# Patient Record
Sex: Male | Born: 1975
Health system: Southern US, Community
[De-identification: ages and names within clinical notes are randomized; demographics above are authoritative.]

## PROBLEM LIST (undated history)

## (undated) DIAGNOSIS — Z789 Other specified health status: Secondary | ICD-10-CM

## (undated) DIAGNOSIS — U071 COVID-19: Secondary | ICD-10-CM

## (undated) HISTORY — PX: INGUINAL HERNIA REPAIR: SUR1180

---

## 2004-05-26 ENCOUNTER — Emergency Department (HOSPITAL_COMMUNITY): Admission: EM | Admit: 2004-05-26 | Discharge: 2004-05-26 | Payer: Self-pay | Admitting: Emergency Medicine

## 2004-10-31 ENCOUNTER — Emergency Department (HOSPITAL_COMMUNITY): Admission: EM | Admit: 2004-10-31 | Discharge: 2004-11-01 | Payer: Self-pay | Admitting: Emergency Medicine

## 2005-11-02 ENCOUNTER — Emergency Department (HOSPITAL_COMMUNITY): Admission: EM | Admit: 2005-11-02 | Discharge: 2005-11-02 | Payer: Self-pay | Admitting: Emergency Medicine

## 2005-12-03 ENCOUNTER — Emergency Department (HOSPITAL_COMMUNITY): Admission: EM | Admit: 2005-12-03 | Discharge: 2005-12-03 | Payer: Self-pay | Admitting: Emergency Medicine

## 2010-09-20 ENCOUNTER — Encounter: Payer: Self-pay | Admitting: Internal Medicine

## 2011-10-27 ENCOUNTER — Encounter: Payer: 59 | Admitting: Physician Assistant

## 2011-11-10 ENCOUNTER — Ambulatory Visit (INDEPENDENT_AMBULATORY_CARE_PROVIDER_SITE_OTHER): Payer: 59 | Admitting: Family Medicine

## 2011-11-10 ENCOUNTER — Ambulatory Visit: Payer: 59

## 2011-11-10 VITALS — BP 132/78 | HR 84 | Temp 98.5°F | Resp 18 | Ht 71.0 in | Wt 270.0 lb

## 2011-11-10 DIAGNOSIS — R05 Cough: Secondary | ICD-10-CM

## 2011-11-10 DIAGNOSIS — H6692 Otitis media, unspecified, left ear: Secondary | ICD-10-CM

## 2011-11-10 DIAGNOSIS — R059 Cough, unspecified: Secondary | ICD-10-CM

## 2011-11-10 DIAGNOSIS — H6690 Otitis media, unspecified, unspecified ear: Secondary | ICD-10-CM

## 2011-11-10 DIAGNOSIS — J329 Chronic sinusitis, unspecified: Secondary | ICD-10-CM

## 2011-11-10 MED ORDER — AMOXICILLIN-POT CLAVULANATE 875-125 MG PO TABS: 1.0000 | ORAL_TABLET | Freq: Two times a day (BID) | ORAL | Status: AC

## 2011-11-10 MED ORDER — HYDROCODONE-HOMATROPINE 5-1.5 MG/5ML PO SYRP: 5.0000 mL | ORAL_SOLUTION | Freq: Three times a day (TID) | ORAL | Status: AC | PRN

## 2011-11-10 NOTE — Progress Notes (Signed)
  [facility]:  Office Visit  Chief Complaint:  Chief Complaint  Patient presents with  . Cough    * 3 weeks  . Headache    sinus pain/ear pain  . Chills    HPI: Samuel Cruz is a 36 y.o. male who complains of 3 week h/o cough, dry and was given abx for sinusitis but dd not resolve completely. He aslo has sinus pressure and HA and ear pain which is new. Has had the flu vaccine. Has a hx of asthma bronchitis without formal dx of bronchitis.  No past medical history on file. No past surgical history on file. History   Social History  . Marital Status: Married    Spouse Name: N/A    Number of Children: N/A  . Years of Education: N/A   Social History Main Topics  . Smoking status: Never Smoker   . Smokeless tobacco: None  . Alcohol Use: None  . Drug Use: None  . Sexually Active: None   Other Topics Concern  . None   Social History Narrative  . None   No family history on file. No Known Allergies Prior to Admission medications   Medication Sig Start Date End Date Taking? Authorizing Provider  amoxicillin-clavulanate (AUGMENTIN) 875-125 MG per tablet Take 1 tablet by mouth 2 (two) times daily. 11/10/11 11/20/11  Shalev Helminiak P Oluwanifemi Susman, DO  HYDROcodone-homatropine (HYCODAN) 5-1.5 MG/5ML syrup Take 5 mLs by mouth every 8 (eight) hours as needed for cough. 11/10/11 11/20/11  Rachelann Enloe P Malan Werk, DO     ROS: The patient denies fevers, chills, night sweats, unintentional weight loss, chest pain, palpitations, wheezing, dyspnea on exertion, nausea, vomiting, abdominal pain, dysuria, hematuria, melena, numbness, weakness, or tingling.   All other systems have been reviewed and were otherwise negative with the exception of those mentioned in the HPI and as above.    PHYSICAL EXAM: Filed Vitals:   11/10/11 2011  BP: 132/78  Pulse: 84  Temp: 98.5 F (36.9 C)  Resp: 18   Filed Vitals:   11/10/11 2011  Height: 5\' 11"  (1.803 m)  Weight: 270 lb (122.471 kg)   Body mass index is  37.66 kg/(m^2).  General: Alert, no acute distress HEENT:  Normocephalic, atraumatic, oropharynx patent. Left TM red, boggy Cardiovascular:  Regular rate and rhythm, no rubs murmurs or gallops.  No Carotid bruits, radial pulse intact. No pedal edema.  Respiratory: Clear to auscultation bilaterally.  No wheezes, rales, or rhonchi.  No cyanosis, no use of accessory musculature GI: No organomegaly, abdomen is soft and non-tender, positive bowel sounds.  No masses. Skin: No rashes. Neurologic: Facial musculature symmetric. Psychiatric: Patient is appropriate throughout our interaction. Lymphatic: No cervical lymphadenopathy Musculoskeletal: Gait intact.   LABS: No results found for this or any previous visit.   EKG/XRAY:   Primary read interpreted by Dr. Conley Rolls at [facility]. NO infiltrate or pneumothorax. Increase vascularization.   ASSESSMENT/PLAN: Encounter Diagnoses  Name Primary?  . Cough Yes  . Left otitis media   . Sinusitis     Rx Augmentin 875 mg BID x 10 days Tessalon Perles Hydromet syrup.   Hamilton Capri PHUONG, DO 11/10/2011 8:56 PM

## 2011-11-11 ENCOUNTER — Telehealth: Payer: Self-pay | Admitting: Family Medicine

## 2011-11-11 NOTE — Telephone Encounter (Signed)
LM that official CXR was negative for any acute process.

## 2012-01-14 ENCOUNTER — Telehealth: Payer: Self-pay

## 2012-01-18 NOTE — Telephone Encounter (Signed)
Will close.

## 2012-05-06 NOTE — Progress Notes (Signed)
This encounter was created in error - please disregard.

## 2013-01-28 ENCOUNTER — Encounter (HOSPITAL_COMMUNITY): Payer: Self-pay | Admitting: Emergency Medicine

## 2013-01-28 ENCOUNTER — Emergency Department (HOSPITAL_COMMUNITY)
Admission: EM | Admit: 2013-01-28 | Discharge: 2013-01-28 | Disposition: A | Discharge status: Home / Self Care | Payer: 59 | Source: Home / Self Care | Attending: Emergency Medicine | Admitting: Emergency Medicine

## 2013-01-28 DIAGNOSIS — J020 Streptococcal pharyngitis: Secondary | ICD-10-CM

## 2013-01-28 LAB — POCT RAPID STREP A: Streptococcus, Group A Screen (Direct): POSITIVE — AB

## 2013-01-28 MED ORDER — PENICILLIN V POTASSIUM 500 MG PO TABS: 500.0000 mg | ORAL_TABLET | Freq: Three times a day (TID) | ORAL | Status: DC

## 2013-01-28 NOTE — ED Provider Notes (Signed)
Chief Complaint:  No chief complaint on file.   History of Present Illness:   Samuel Cruz is a 37 year old male who has had a two-day history of myalgias, malaise, fatigue, headache, low-grade fever, nausea, sore throat, pain on swallowing, and aching behind his eyes. He denies any nasal congestion, rhinorrhea, swollen glands, cough, or GI symptoms. He was exposed to his wife who had strep about a month ago. He himself has had strep before but it's been years.  Review of Systems:  Other than as noted above, the patient denies any of the following symptoms. Systemic:  No fever, chills, sweats, fatigue, myalgias, headache, or anorexia. Eye:  No redness, pain or drainage. ENT:  No earache, ear congestion, nasal congestion, sneezing, rhinorrhea, sinus pressure, sinus pain, or post nasal drip. Lungs:  No cough, sputum production, wheezing, shortness of breath, or chest pain. GI:  No abdominal pain, nausea, vomiting, or diarrhea. Skin:  No rash or itching.  PMFSH:  Past medical history, family history, social history, meds, allergies, and nurse's notes were reviewed.  There is no known exposure to strep or mono.  No prior history of step or mono.  The patient denies use of tobacco.   Physical Exam:   Vital signs:  BP 110/57  Pulse 66  Temp(Src) 98.3 F (36.8 C) (Oral)  SpO2 97% General:  Alert, in no distress. Eye:  No conjunctival injection or drainage. Lids were normal. ENT:  TMs and canals were normal, without erythema or inflammation.  Nasal mucosa was clear and uncongested, without drainage.  Mucous membranes were moist.  Exam of pharynx reveals erythema but no exudate or drainage.  There were no oral ulcerations or lesions. Neck:  Supple, no adenopathy, tenderness or mass. Lungs:  No respiratory distress.  Lungs were clear to auscultation, without wheezes, rales or rhonchi.  Breath sounds were clear and equal bilaterally.  Heart:  Regular rhythm, without gallops, murmers or rubs. Skin:   Clear, warm, and dry, without rash or lesions.  Labs:   Results for orders placed during the hospital encounter of 01/28/13  POCT RAPID STREP A ([facility] ONLY)      Result Value Range   Streptococcus, Group A Screen (Direct) POSITIVE (*) NEGATIVE   Assessment:  The encounter diagnosis was Strep throat.  Has typical strep throat. No evidence for peritonsillar abscess. He probably caught it from his wife.  Plan:   1.  The following meds were prescribed:   New Prescriptions   PENICILLIN V POTASSIUM (VEETID) 500 MG TABLET    Take 1 tablet (500 mg total) by mouth 3 (three) times daily.   2.  The patient was instructed in symptomatic care including hot saline gargles, throat lozenges, infectious precautions, and need to trade out toothbrush. Handouts were given. 3.  The patient was told to return if becoming worse in any way, if no better in 2 or 3 days, and given some red flag symptoms such as difficulty swallowing or breathing that would indicate earlier return. 4.  Follow up here if no better in 2 or 3 days.    Reuben Likes, MD 01/28/13 [phone]

## 2013-02-24 ENCOUNTER — Emergency Department (HOSPITAL_COMMUNITY)
Admission: EM | Admit: 2013-02-24 | Discharge: 2013-02-24 | Disposition: A | Discharge status: Home / Self Care | Payer: 59 | Source: Home / Self Care | Attending: Emergency Medicine | Admitting: Emergency Medicine

## 2013-02-24 ENCOUNTER — Encounter (HOSPITAL_COMMUNITY): Payer: Self-pay | Admitting: Emergency Medicine

## 2013-02-24 DIAGNOSIS — M109 Gout, unspecified: Secondary | ICD-10-CM

## 2013-02-24 LAB — URIC ACID: Uric Acid, Serum: 5.9 mg/dL (ref 4.0–7.8)

## 2013-02-24 MED ORDER — OXYCODONE-ACETAMINOPHEN 5-325 MG PO TABS: ORAL_TABLET | ORAL | Status: DC

## 2013-02-24 MED ORDER — METHYLPREDNISOLONE ACETATE 80 MG/ML IJ SUSP
80.0000 mg | Freq: Once | INTRAMUSCULAR | Status: AC
  Administered 2013-02-24: 80 mg via INTRAMUSCULAR

## 2013-02-24 MED ORDER — KETOROLAC TROMETHAMINE 60 MG/2ML IM SOLN
60.0000 mg | Freq: Once | INTRAMUSCULAR | Status: AC
  Administered 2013-02-24: 60 mg via INTRAMUSCULAR

## 2013-02-24 MED ORDER — METHYLPREDNISOLONE ACETATE 80 MG/ML IJ SUSP
INTRAMUSCULAR | Status: AC
  Filled 2013-02-24: qty 1

## 2013-02-24 MED ORDER — KETOROLAC TROMETHAMINE 60 MG/2ML IM SOLN
INTRAMUSCULAR | Status: AC
  Filled 2013-02-24: qty 2

## 2013-02-24 MED ORDER — COLCHICINE 0.6 MG PO TABS: ORAL_TABLET | ORAL | Status: DC

## 2013-02-24 NOTE — ED Notes (Signed)
Pt c/o right ankle and left knee swelling onset 5 days... Last past 3 days has been getting worse... Hx of gout... Denies: inj/trauma, fevers... He is alert w/no signs of acute distress... Steady gait

## 2013-02-24 NOTE — ED Provider Notes (Signed)
Chief Complaint:   Chief Complaint  Patient presents with  . Joint Swelling    History of Present Illness:   Samuel Cruz is a 37 year old male with a prior history of gout who has had a five-day history of pain and swelling in his left knee and right ankle. It's been particularly worse the past 3 days. He denies any trauma or dietary indiscretion. No fever or chills. His last gout attack was 4 years ago. He's not taking medication for urate lowering.  Review of Systems:  Other than noted above, the patient denies any of the following symptoms: Systemic:  No fevers, chills, sweats, or aches.  No fatigue or tiredness. Musculoskeletal:  No joint pain, arthritis, bursitis, swelling, back pain, or neck pain. Neurological:  No muscular weakness, paresthesias, headache, or trouble with speech or coordination.  No dizziness.  PMFSH:  Past medical history, family history, social history, meds, and allergies were reviewed.    Physical Exam:   Vital signs:  BP 115/63  Pulse 68  Temp(Src) 98 F (36.7 C) (Oral)  Resp 20  SpO2 100% Gen:  Alert and oriented times 3.  In no distress. Musculoskeletal: There was swelling, pain to palpation, and slight redness and heat in both the left knee and the right ankle. These both had a limited range of motion with pain. There was a left knee effusion.  Otherwise, all joints had a full a ROM with no swelling, bruising or deformity.  No edema, pulses full. Extremities were warm and pink.  Capillary refill was brisk.  Skin:  Clear, warm and dry.  No rash. Neuro:  Alert and oriented times 3.  Muscle strength was normal.  Sensation was intact to light touch.   Results for orders placed during the hospital encounter of 02/24/13  URIC ACID      Result Value Range   Uric Acid, Serum 5.9  4.0 - 7.8 mg/dL   Course in Urgent Care Center:   Given Toradol 60 mg IM and Depo-Medrol 80 mg IM.  Assessment:  The encounter diagnosis was Gout attack.  Even note his urine  urate is in the normal range, it's highly likely he is having a gout attack. He was given a prescription for colchicine was told to followup with his primary care physician next week.  Plan:   1.  The following meds were prescribed:   Discharge Medication List as of 02/24/2013  5:44 PM    START taking these medications   Details  colchicine 0.6 MG tablet Take 2 now and 1 in 1 hour.  May repeat dose once daily.  For gout attack., Normal    oxyCODONE-acetaminophen (PERCOCET) 5-325 MG per tablet 1 to 2 tablets every 6 hours as needed for pain., Print       2.  The patient was instructed in symptomatic care, including rest and activity, elevation, application of ice and compression.  Appropriate handouts were given. 3.  The patient was told to return if becoming worse in any way, if no better in 3 or 4 days, and given some red flag symptoms such as fever or worsening pain that would indicate earlier return.   4.  The patient was told to follow up with Dr. Rayfield Citizen next week.    Reuben Likes, MD 02/24/13 2059

## 2013-04-30 ENCOUNTER — Encounter (HOSPITAL_COMMUNITY): Payer: Self-pay | Admitting: *Deleted

## 2013-04-30 ENCOUNTER — Emergency Department (HOSPITAL_COMMUNITY)
Admission: EM | Admit: 2013-04-30 | Discharge: 2013-04-30 | Disposition: A | Discharge status: Home / Self Care | Payer: 59 | Attending: Emergency Medicine | Admitting: Emergency Medicine

## 2013-04-30 ENCOUNTER — Emergency Department (HOSPITAL_COMMUNITY): Payer: 59

## 2013-04-30 DIAGNOSIS — Z8659 Personal history of other mental and behavioral disorders: Secondary | ICD-10-CM | POA: Insufficient documentation

## 2013-04-30 DIAGNOSIS — R002 Palpitations: Secondary | ICD-10-CM | POA: Insufficient documentation

## 2013-04-30 DIAGNOSIS — E876 Hypokalemia: Secondary | ICD-10-CM | POA: Insufficient documentation

## 2013-04-30 DIAGNOSIS — R0602 Shortness of breath: Secondary | ICD-10-CM | POA: Insufficient documentation

## 2013-04-30 DIAGNOSIS — Z79899 Other long term (current) drug therapy: Secondary | ICD-10-CM | POA: Insufficient documentation

## 2013-04-30 DIAGNOSIS — J45909 Unspecified asthma, uncomplicated: Secondary | ICD-10-CM | POA: Insufficient documentation

## 2013-04-30 LAB — CBC
Hemoglobin: 13.1 g/dL (ref 13.0–17.0)
MCH: 31.1 pg (ref 26.0–34.0)
RBC: 4.21 MIL/uL — ABNORMAL LOW (ref 4.22–5.81)
WBC: 5.3 10*3/uL (ref 4.0–10.5)

## 2013-04-30 LAB — COMPREHENSIVE METABOLIC PANEL
AST: 23 U/L (ref 0–37)
Albumin: 3.5 g/dL (ref 3.5–5.2)
CO2: 25 mEq/L (ref 19–32)
Calcium: 9.1 mg/dL (ref 8.4–10.5)
Creatinine, Ser: 1.12 mg/dL (ref 0.50–1.35)
GFR calc non Af Amer: 84 mL/min — ABNORMAL LOW (ref >90)

## 2013-04-30 LAB — POCT I-STAT TROPONIN I: Troponin i, poc: 0.01 ng/mL (ref 0.00–0.08)

## 2013-04-30 MED ORDER — POTASSIUM CHLORIDE CRYS ER 20 MEQ PO TBCR
40.0000 meq | EXTENDED_RELEASE_TABLET | Freq: Once | ORAL | Status: AC
  Administered 2013-04-30: 40 meq via ORAL
  Filled 2013-04-30: qty 2

## 2013-04-30 NOTE — ED Notes (Signed)
Pt states understanding of discharge instructions 

## 2013-04-30 NOTE — ED Provider Notes (Signed)
CSN: [id number]     Arrival date & time 04/30/13  0017 History   First MD Initiated Contact with Patient 04/30/13 0032     No chief complaint on file.  (Consider location/radiation/quality/duration/timing/severity/associated sxs/prior Treatment) HPI Generally healthy man with history of anxiety who presents after awaking from sleep with feeling of heart racing and SOB. Sx resolved after a few minutes. Had resolved by the time EMS arrived. VS normal at the scene. Patient asked to be transmitted to ED for evaluation. He is without symptoms at this time. Denies experiencing cp, cough, fever.   Says he has had panic attacks in the past which have caused him to awake from sleep but, typically they are much shorter lived. Denies drug and alcohol use.   Past Medical History  Diagnosis Date  . Asthma    No past surgical history on file. Family History  Problem Relation Age of Onset  . Adopted: Yes   History  Substance Use Topics  . Smoking status: Never Smoker   . Smokeless tobacco: Not on file  . Alcohol Use: Yes    Review of Systems Gen: no weight loss, fevers, chills, night sweats Eyes: no discharge or drainage, no occular pain or visual changes Nose: no epistaxis or rhinorrhea Mouth: no dental pain, no sore throat Neck: no neck pain Lungs: As per history of present illness, otherwise negative CV: As per history of present illness, otherwise negative Abd: no abdominal pain, nausea, vomiting GU: no dysuria or gross hematuria MSK: no myalgias or arthralgias Neuro: no headache, no focal neurologic deficits Skin: no rash Psyche: As per history of present illness, otherwise negative  Allergies  Review of patient's allergies indicates no known allergies.  Home Medications   Current Outpatient Rx  Name  Route  Sig  Dispense  Refill  . colchicine 0.6 MG tablet   Oral   Take 0.6 mg by mouth See admin instructions. Take 2 tablets at onset of attatck and 1 tablet in 1 hour.  May  repeat dose once daily.  For gout attack.          There were no vitals taken for this visit. Physical Exam Gen: well developed and well nourished appearing Head: NCAT Eyes: PERL, EOMI Nose: no epistaixis or rhinorrhea Mouth/throat: mucosa is moist and pink Neck: supple, no stridor Lungs: CTA B, no wheezing, rhonchi or rales Abd: soft, notender, nondistended Back: no ttp, no cva ttp Skin: no rashese, wnl Neuro: CN ii-xii grossly intact, no focal deficits Psyche; normal affect,  calm and cooperative.   ED Course  Procedures (including critical care time) Labs Review Results for orders placed during the hospital encounter of 04/30/13 (from the past 24 hour(s))  COMPREHENSIVE METABOLIC PANEL     Status: Abnormal   Collection Time    04/30/13  1:05 AM      Result Value Range   Sodium 139  135 - 145 mEq/L   Potassium 3.3 (*) 3.5 - 5.1 mEq/L   Chloride 104  96 - 112 mEq/L   CO2 25  19 - 32 mEq/L   Glucose, Bld 87  70 - 99 mg/dL   BUN 17  6 - 23 mg/dL   Creatinine, Ser 7.82  0.50 - 1.35 mg/dL   Calcium 9.1  8.4 - 95.6 mg/dL   Total Protein 6.5  6.0 - 8.3 g/dL   Albumin 3.5  3.5 - 5.2 g/dL   AST 23  0 - 37 U/L   ALT 23  0 - 53 U/L   Alkaline Phosphatase 63  39 - 117 U/L   Total Bilirubin 0.3  0.3 - 1.2 mg/dL   GFR calc non Af Amer 84 (*) >90 mL/min   GFR calc Af Amer >90  >90 mL/min  CBC     Status: Abnormal   Collection Time    04/30/13  1:05 AM      Result Value Range   WBC 5.3  4.0 - 10.5 K/uL   RBC 4.21 (*) 4.22 - 5.81 MIL/uL   Hemoglobin 13.1  13.0 - 17.0 g/dL   HCT 16.1 (*) 09.6 - 04.5 %   MCV 89.8  78.0 - 100.0 fL   MCH 31.1  26.0 - 34.0 pg   MCHC 34.7  30.0 - 36.0 g/dL   RDW 40.9  81.1 - 91.4 %   Platelets 175  150 - 400 K/uL  POCT I-STAT TROPONIN I     Status: None   Collection Time    04/30/13  1:43 AM      Result Value Range   Troponin i, poc 0.01  0.00 - 0.08 ng/mL   Comment 3           Imaging Review Dg Chest 2 View  04/30/2013   CLINICAL DATA:   Chest pressure, shortness of breath.  EXAM: CHEST  2 VIEW  COMPARISON:  11/10/2011  FINDINGS: The heart size and mediastinal contours are within normal limits. Both lungs are clear. The visualized skeletal structures are unremarkable.  IMPRESSION: No active cardiopulmonary disease.   Electronically Signed   By: Charlett Nose   On: 04/30/2013 01:51   EKG: nsr, no acute ischemic changes, normal intervals, normal axis, normal qrs complex  CXR: normal cardiac silloute, normal appearing mediastinum, no infiltrates, no acute process identified.   MDM  No diagnosis found. DDX: dysrythmia, anemia, electrolyte disturbance, anxiety/panic attack, pna.   The patient's work up is remarkable only for slightly low K level. Normal CBC, tpn, EKG, CXR. Patient remains asymptomatic. He is stable for discharge. I have counseled him to please follow up wit his PCP on Tuesday for a recheck and discussion of the potential need for further evaluation.     Brandt Loosen, MD 04/30/13 2158

## 2013-04-30 NOTE — ED Notes (Signed)
Pt arrived via [facility] c/o not feeling right after waking up from sleep. Hx Anxiety. EMS VS CBG 135, O2 sat 99% RA, 12 lead WNL

## 2014-01-28 ENCOUNTER — Ambulatory Visit (INDEPENDENT_AMBULATORY_CARE_PROVIDER_SITE_OTHER): Payer: 59 | Admitting: Emergency Medicine

## 2014-01-28 VITALS — BP 106/60 | HR 73 | Temp 97.7°F | Ht 70.0 in | Wt 195.6 lb

## 2014-01-28 DIAGNOSIS — J209 Acute bronchitis, unspecified: Secondary | ICD-10-CM

## 2014-01-28 DIAGNOSIS — J018 Other acute sinusitis: Secondary | ICD-10-CM

## 2014-01-28 MED ORDER — PSEUDOEPHEDRINE-GUAIFENESIN ER 60-600 MG PO TB12: 1.0000 | ORAL_TABLET | Freq: Two times a day (BID) | ORAL | Status: DC

## 2014-01-28 MED ORDER — PROMETHAZINE-CODEINE 6.25-10 MG/5ML PO SYRP: 5.0000 mL | ORAL_SOLUTION | Freq: Four times a day (QID) | ORAL | Status: DC | PRN

## 2014-01-28 MED ORDER — AMOXICILLIN-POT CLAVULANATE 875-125 MG PO TABS: 1.0000 | ORAL_TABLET | Freq: Two times a day (BID) | ORAL | Status: DC

## 2014-01-28 NOTE — Progress Notes (Signed)
[facility] [address] [place] [address] [phone]  Date:  01/28/2014   Name:  Samuel Cruz   DOB:  February 04, 1976   MRN:  [id number]  PCP:  Londell Moh, MD    Chief Complaint: URI sx   History of Present Illness:  Samuel Cruz is a 38 y.o. very pleasant male patient who presents with the following:  Ill with nasal congestion and drainage over the past week.  Purulent drainage at times with post nasal drainage.  Has sore throat.  Cough occasionally productive of mucopurulent sputum.  No fever or chills, no stool change or rash.  No nausea or vomiting.  Worse at night when recumbent.  No improvement with over the counter medications or other home remedies. Denies other complaint or health concern today.   There are no active problems to display for this patient.   Past Medical History  Diagnosis Date  . Asthma     History reviewed. No pertinent past surgical history.  History  Substance Use Topics  . Smoking status: Never Smoker   . Smokeless tobacco: Not on file  . Alcohol Use: Yes    Family History  Problem Relation Age of Onset  . Adopted: Yes    No Known Allergies  Medication list has been reviewed and updated.  No current outpatient prescriptions on file prior to visit.   No current facility-administered medications on file prior to visit.    Review of Systems:  I have reviewed the patient's medical history in detail and updated the computerized patient record. i   Physical Examination: Filed Vitals:   01/28/14 1557  BP: 106/60  Pulse: 73  Temp: 97.7 F (36.5 C)   Filed Vitals:   01/28/14 1557  Height: 5\' 10"  (1.778 m)  Weight: 195 lb 9.6 oz (88.724 kg)   Body mass index is 28.07 kg/(m^2). Ideal Body Weight: Weight in (lb) to have BMI = 25: 173.9  GEN: WDWN, NAD, Non-toxic, A & O x 3 HEENT: Atraumatic, Normocephalic. Neck supple. No masses, No LAD. Ears and Nose: No external deformity. CV: RRR, No  M/G/R. No JVD. No thrill. No extra heart sounds. PULM: CTA B, no wheezes, crackles, rhonchi. No retractions. No resp. distress. No accessory muscle use. ABD: S, NT, ND, +BS. No rebound. No HSM. EXTR: No c/c/e NEURO Normal gait.  PSYCH: Normally interactive. Conversant. Not depressed or anxious appearing.  Calm demeanor.    Assessment and Plan: Sinusitis Bronchitis augementin Phen c cod mucinex d  Signed,  Phillips Odor, MD

## 2014-01-28 NOTE — Patient Instructions (Signed)

## 2014-07-03 ENCOUNTER — Ambulatory Visit (INDEPENDENT_AMBULATORY_CARE_PROVIDER_SITE_OTHER): Payer: 59 | Admitting: Family Medicine

## 2014-07-03 VITALS — BP 114/70 | HR 53 | Temp 97.6°F | Resp 16 | Ht 70.5 in | Wt 195.0 lb

## 2014-07-03 DIAGNOSIS — M25571 Pain in right ankle and joints of right foot: Secondary | ICD-10-CM

## 2014-07-03 DIAGNOSIS — M10071 Idiopathic gout, right ankle and foot: Secondary | ICD-10-CM

## 2014-07-03 DIAGNOSIS — M109 Gout, unspecified: Secondary | ICD-10-CM

## 2014-07-03 MED ORDER — METHYLPREDNISOLONE ACETATE 80 MG/ML IJ SUSP
80.0000 mg | Freq: Once | INTRAMUSCULAR | Status: AC
  Administered 2014-07-03: 80 mg via INTRAMUSCULAR

## 2014-07-03 MED ORDER — COLCHICINE 0.6 MG PO TABS: ORAL_TABLET | ORAL | Status: DC

## 2014-07-03 NOTE — Progress Notes (Signed)
  Subjective: 38 year old man who is here because of right foot pain.he has a history of some gout flares in the past. He was last treated early in the summer 1 one half years ago [facility]. He has been working out a lot. He has lost a lot of weight. He did do some running this weekend and subsequent to that has developed the pain. It feels like the gout felt in the past.  Objective: Tender at the in TP joints of the first and second toes of the right foot  Assessment: Probable gout., could be stress injury  Plan: Colchicine Injection of Depo-Medrol 80 Return if worse

## 2014-07-03 NOTE — Patient Instructions (Signed)
Take colchicine 2 initially, then one twice daily for 3 or 4 days, then once  Daily for 3 or 4 days, then as needed  Return if worse or recurrences. I will let you know the results of your labs.

## 2014-07-04 LAB — COMPREHENSIVE METABOLIC PANEL
ALT: 24 U/L (ref 0–53)
AST: 21 U/L (ref 0–37)
Albumin: 4.3 g/dL (ref 3.5–5.2)
Alkaline Phosphatase: 71 U/L (ref 39–117)
BILIRUBIN TOTAL: 0.7 mg/dL (ref 0.2–1.2)
BUN: 12 mg/dL (ref 6–23)
CALCIUM: 9.8 mg/dL (ref 8.4–10.5)
CHLORIDE: 101 meq/L (ref 96–112)
CO2: 25 meq/L (ref 19–32)
CREATININE: 0.96 mg/dL (ref 0.50–1.35)
Glucose, Bld: 74 mg/dL (ref 70–99)
Potassium: 4.2 mEq/L (ref 3.5–5.3)
Sodium: 137 mEq/L (ref 135–145)
Total Protein: 7.3 g/dL (ref 6.0–8.3)

## 2014-07-04 LAB — URIC ACID: Uric Acid, Serum: 6.5 mg/dL (ref 4.0–7.8)

## 2014-07-08 ENCOUNTER — Encounter: Payer: Self-pay | Admitting: Family Medicine

## 2015-01-07 ENCOUNTER — Encounter: Payer: Self-pay | Admitting: General Surgery

## 2015-01-07 NOTE — Progress Notes (Signed)
Samuel GentaDaryl Cruz 01/07/2015 1:31 PM Location: [facility] Patient #: [id number] DOB: 1975/10/31 Married / Language: [place] / Race: White Male  History of Present Illness Samuel Cruz(Samuel Cruz J. Samuel Munar Cruz; 01/07/2015 2:18 PM) Patient words: hernia.  The patient is a 39 year old male   Note:He is referred by Dr. Renne CriglerPharr for further evaluation of a painful right inguinal hernia. He is an avid runner and has lost 80 pounds. For the past 2 months, he has noted right groin discomfort. He saw Dr. Renne CriglerPharr and was noted to have a right inguinal bulge consistent with a hernia. He is now sent over here to discuss further treatment for that. He does not have any difficulty with urination or constipation.   Other Problems Samuel Cruz(Samuel Cruz; 01/07/2015 1:32 PM) Anxiety Disorder Gastroesophageal Reflux Disease Other disease, cancer, significant illness  Past Surgical History Samuel Cruz(Samuel Cruz; 01/07/2015 1:32 PM) No pertinent past surgical history  Diagnostic Studies History Samuel Cruz(Samuel Cruz; 01/07/2015 1:32 PM) Colonoscopy never  Allergies Samuel Cruz(Samuel Cruz; 01/07/2015 1:32 PM) No Known Drug Allergies05/05/2015  Medication History Samuel Cruz(Samuel Cruz; 01/07/2015 1:32 PM) No Current Medications Medications Reconciled  Social History Samuel Cruz(Samuel Cruz; 01/07/2015 1:32 PM) Alcohol use Occasional alcohol use. Caffeine use Carbonated beverages, Coffee, Tea. No drug use Tobacco use Never smoker.  Family History Samuel Cruz(Samuel Cruz, [place]; 01/07/2015 1:32 PM) Family history unknown First Degree Relatives  Review of Systems Samuel Cruz(Samuel Cruz Cruz; 01/07/2015 1:32 PM) General Not Present- Appetite Loss, Chills, Fatigue, Fever, Night Sweats, Weight Gain and Weight Loss. Skin Not Present- Change in Wart/Mole, Dryness, Hives, Jaundice, New Lesions, Non-Healing Wounds, Rash and Ulcer. HEENT Present- Ringing in the Ears and Seasonal Allergies. Not Present- Earache, Hearing Loss, Hoarseness, Nose Bleed, Oral Ulcers, Sinus  Pain, Sore Throat, Visual Disturbances, Wears glasses/contact lenses and Yellow Eyes. Respiratory Not Present- Bloody sputum, Chronic Cough, Difficulty Breathing, Snoring and Wheezing. Breast Not Present- Breast Mass, Breast Pain, Nipple Discharge and Skin Changes. Cardiovascular Not Present- Chest Pain, Difficulty Breathing Lying Down, Leg Cramps, Palpitations, Rapid Heart Rate, Shortness of Breath and Swelling of Extremities. Gastrointestinal Not Present- Abdominal Pain, Bloating, Bloody Stool, Change in Bowel Habits, Chronic diarrhea, Constipation, Difficulty Swallowing, Excessive gas, Gets full quickly at meals, Hemorrhoids, Indigestion, Nausea, Rectal Pain and Vomiting. Male Genitourinary Not Present- Blood in Urine, Change in Urinary Stream, Frequency, Impotence, Nocturia, Painful Urination, Urgency and Urine Leakage. Musculoskeletal Not Present- Back Pain, Joint Pain, Joint Stiffness, Muscle Pain, Muscle Weakness and Swelling of Extremities. Neurological Not Present- Decreased Memory, Fainting, Headaches, Numbness, Seizures, Tingling, Tremor, Trouble walking and Weakness. Psychiatric Not Present- Anxiety, Bipolar, Change in Sleep Pattern, Depression, Fearful and Frequent crying. Endocrine Not Present- Cold Intolerance, Excessive Hunger, Hair Changes, Heat Intolerance, Hot flashes and New Diabetes. Hematology Not Present- Easy Bruising, Excessive bleeding, Gland problems, HIV and Persistent Infections.   Vitals Samuel Cruz(Samuel Cruz Cruz; 01/07/2015 1:33 PM) 01/07/2015 1:32 PM Weight: 199 lb Height: 71in Body Surface Area: 2.13 m Body Mass Index: 27.75 kg/m Temp.: 97.57F(Oral)  Pulse: 79 (Regular)  Resp.: 18 (Unlabored)  BP: 130/68 (Sitting, Left Arm, Standard)    Physical Exam Samuel Cruz(Samuel Cruz J. Samuel Canny Cruz; 01/07/2015 2:19 PM) The physical exam findings are as follows: Note:General: WDWN in NAD. Pleasant and cooperative. His wife is with him.  CV: RRR, no murmur, no JVD.  CHEST: Breath  sounds equal and clear. Respirations nonlabored.  BREASTS: Symmetrical in size. No dominant masses, nipple discharge or suspicious skin lesions.  ABDOMEN: Soft, nontender, nondistended, no masses, no organomegaly, active bowel  sounds, no scars, no hernias.  GU: Bilateral, reducible inguinal bulges  NEUROLOGIC: Alert and oriented, answers questions appropriately, normal gait and station.  PSYCHIATRIC: Normal mood, affect , and behavior.    Assessment & Plan Samuel Cruz(Samuel Cruz J. Samuel Kernen Cruz; 01/07/2015 2:20 PM) BILATERAL RECURRENT INGUINAL HERNIA WITHOUT OBSTRUCTION OR GANGRENE (550.93  K40.21) Impression: Right side is symptomatic, left side is asymptomatic. I recommended laparoscopic repair of bilateral inguinal hernias with mesh and he is in agreement with this.  Plan: Laparoscopic bilateral inguinal hernia repair with mesh. I have explained the procedure, risks, and aftercare of inguinal hernia repair. Risks include but are not limited to bleeding, infection, wound problems, anesthesia, recurrence, bladder or intestine injury, urinary retention, testicular dysfunction, chronic pain, mesh problems. He seems to understand and agrees to proceed.  Samuel Peaceodd Elloise Roark, Cruz

## 2015-01-22 ENCOUNTER — Ambulatory Visit: Payer: 59 | Admitting: Sports Medicine

## 2015-02-01 ENCOUNTER — Other Ambulatory Visit: Payer: Self-pay | Admitting: General Surgery

## 2015-02-26 ENCOUNTER — Encounter (HOSPITAL_COMMUNITY): Payer: Self-pay | Admitting: *Deleted

## 2015-03-01 ENCOUNTER — Encounter (HOSPITAL_COMMUNITY): Payer: Self-pay | Admitting: *Deleted

## 2015-03-01 ENCOUNTER — Ambulatory Visit (HOSPITAL_COMMUNITY)
Admission: RE | Admit: 2015-03-01 | Discharge: 2015-03-01 | Disposition: A | Discharge status: Home / Self Care | Payer: 59 | Source: Ambulatory Visit | Attending: General Surgery | Admitting: General Surgery

## 2015-03-01 ENCOUNTER — Ambulatory Visit (HOSPITAL_COMMUNITY): Payer: 59 | Admitting: Anesthesiology

## 2015-03-01 ENCOUNTER — Encounter (HOSPITAL_COMMUNITY)
Admission: RE | Disposition: A | Discharge status: Home / Self Care | Payer: Self-pay | Source: Ambulatory Visit | Attending: General Surgery

## 2015-03-01 DIAGNOSIS — Z791 Long term (current) use of non-steroidal anti-inflammatories (NSAID): Secondary | ICD-10-CM | POA: Insufficient documentation

## 2015-03-01 DIAGNOSIS — K4020 Bilateral inguinal hernia, without obstruction or gangrene, not specified as recurrent: Secondary | ICD-10-CM | POA: Diagnosis not present

## 2015-03-01 DIAGNOSIS — Z79899 Other long term (current) drug therapy: Secondary | ICD-10-CM | POA: Insufficient documentation

## 2015-03-01 DIAGNOSIS — J45909 Unspecified asthma, uncomplicated: Secondary | ICD-10-CM | POA: Insufficient documentation

## 2015-03-01 HISTORY — PX: INSERTION OF MESH: SHX5868

## 2015-03-01 HISTORY — PX: INGUINAL HERNIA REPAIR: SHX194

## 2015-03-01 LAB — CBC
HCT: 42 % (ref 39.0–52.0)
HEMOGLOBIN: 14.3 g/dL (ref 13.0–17.0)
MCH: 31.1 pg (ref 26.0–34.0)
MCHC: 34 g/dL (ref 30.0–36.0)
MCV: 91.3 fL (ref 78.0–100.0)
Platelets: 193 10*3/uL (ref 150–400)
RBC: 4.6 MIL/uL (ref 4.22–5.81)
RDW: 12.1 % (ref 11.5–15.5)
WBC: 4.9 10*3/uL (ref 4.0–10.5)

## 2015-03-01 SURGERY — REPAIR, HERNIA, INGUINAL, LAPAROSCOPIC
Anesthesia: General | Laterality: Bilateral

## 2015-03-01 MED ORDER — MIDAZOLAM HCL 5 MG/5ML IJ SOLN
INTRAMUSCULAR | Status: DC | PRN
  Administered 2015-03-01: 2 mg via INTRAVENOUS

## 2015-03-01 MED ORDER — LACTATED RINGERS IV SOLN
INTRAVENOUS | Status: DC
  Administered 2015-03-01: 1000 mL via INTRAVENOUS
  Administered 2015-03-01: 14:00:00 via INTRAVENOUS

## 2015-03-01 MED ORDER — NEOSTIGMINE METHYLSULFATE 10 MG/10ML IV SOLN
INTRAVENOUS | Status: AC
  Filled 2015-03-01: qty 1

## 2015-03-01 MED ORDER — ROCURONIUM BROMIDE 100 MG/10ML IV SOLN
INTRAVENOUS | Status: AC
  Filled 2015-03-01: qty 1

## 2015-03-01 MED ORDER — PROPOFOL 10 MG/ML IV BOLUS
INTRAVENOUS | Status: AC
  Filled 2015-03-01: qty 20

## 2015-03-01 MED ORDER — PROMETHAZINE HCL 25 MG/ML IJ SOLN
INTRAMUSCULAR | Status: AC
  Filled 2015-03-01: qty 1

## 2015-03-01 MED ORDER — NEOSTIGMINE METHYLSULFATE 10 MG/10ML IV SOLN
INTRAVENOUS | Status: DC | PRN
  Administered 2015-03-01: 4 mg via INTRAVENOUS

## 2015-03-01 MED ORDER — OXYCODONE HCL 5 MG PO TABS
5.0000 mg | ORAL_TABLET | ORAL | Status: DC | PRN
  Administered 2015-03-01: 5 mg via ORAL
  Filled 2015-03-01: qty 1

## 2015-03-01 MED ORDER — 0.9 % SODIUM CHLORIDE (POUR BTL) OPTIME
TOPICAL | Status: DC | PRN
  Administered 2015-03-01: 1000 mL

## 2015-03-01 MED ORDER — ACETAMINOPHEN 650 MG RE SUPP
650.0000 mg | RECTAL | Status: DC | PRN
  Filled 2015-03-01: qty 1

## 2015-03-01 MED ORDER — CEFAZOLIN SODIUM-DEXTROSE 2-3 GM-% IV SOLR
2.0000 g | INTRAVENOUS | Status: AC
Start: 2015-03-01 — End: 2015-03-01
  Administered 2015-03-01: 2 g via INTRAVENOUS

## 2015-03-01 MED ORDER — KETOROLAC TROMETHAMINE 30 MG/ML IJ SOLN
INTRAMUSCULAR | Status: AC
  Filled 2015-03-01: qty 1

## 2015-03-01 MED ORDER — BUPIVACAINE HCL (PF) 0.5 % IJ SOLN
INTRAMUSCULAR | Status: DC | PRN
  Administered 2015-03-01: 15 mL

## 2015-03-01 MED ORDER — ONDANSETRON HCL 4 MG/2ML IJ SOLN
INTRAMUSCULAR | Status: DC | PRN
  Administered 2015-03-01: 4 mg via INTRAVENOUS

## 2015-03-01 MED ORDER — PROPOFOL 10 MG/ML IV BOLUS
INTRAVENOUS | Status: DC | PRN
  Administered 2015-03-01: 200 mg via INTRAVENOUS

## 2015-03-01 MED ORDER — SODIUM CHLORIDE 0.9 % IJ SOLN: 3.0000 mL | INTRAMUSCULAR | Status: DC | PRN

## 2015-03-01 MED ORDER — ROCURONIUM BROMIDE 100 MG/10ML IV SOLN
INTRAVENOUS | Status: DC | PRN
  Administered 2015-03-01: 10 mg via INTRAVENOUS
  Administered 2015-03-01: 30 mg via INTRAVENOUS
  Administered 2015-03-01: 10 mg via INTRAVENOUS

## 2015-03-01 MED ORDER — MORPHINE SULFATE 10 MG/ML IJ SOLN: 2.0000 mg | INTRAMUSCULAR | Status: DC | PRN

## 2015-03-01 MED ORDER — LIDOCAINE HCL (PF) 2 % IJ SOLN
INTRAMUSCULAR | Status: DC | PRN
  Administered 2015-03-01: 20 mg via INTRADERMAL

## 2015-03-01 MED ORDER — CEFAZOLIN SODIUM-DEXTROSE 2-3 GM-% IV SOLR
INTRAVENOUS | Status: AC
  Filled 2015-03-01: qty 50

## 2015-03-01 MED ORDER — ONDANSETRON HCL 4 MG PO TABS: 4.0000 mg | ORAL_TABLET | ORAL | Status: DC | PRN

## 2015-03-01 MED ORDER — MIDAZOLAM HCL 2 MG/2ML IJ SOLN
INTRAMUSCULAR | Status: AC
  Filled 2015-03-01: qty 2

## 2015-03-01 MED ORDER — HYDROMORPHONE HCL 1 MG/ML IJ SOLN: 0.2500 mg | INTRAMUSCULAR | Status: DC | PRN

## 2015-03-01 MED ORDER — DEXTROSE IN LACTATED RINGERS 5 % IV SOLN: INTRAVENOUS | Status: DC

## 2015-03-01 MED ORDER — FENTANYL CITRATE (PF) 100 MCG/2ML IJ SOLN
INTRAMUSCULAR | Status: AC
  Filled 2015-03-01: qty 2

## 2015-03-01 MED ORDER — FENTANYL CITRATE (PF) 100 MCG/2ML IJ SOLN
INTRAMUSCULAR | Status: DC | PRN
  Administered 2015-03-01 (×3): 50 ug via INTRAVENOUS
  Administered 2015-03-01: 100 ug via INTRAVENOUS

## 2015-03-01 MED ORDER — GLYCOPYRROLATE 0.2 MG/ML IJ SOLN
INTRAMUSCULAR | Status: DC | PRN
  Administered 2015-03-01: 0.6 mg via INTRAVENOUS

## 2015-03-01 MED ORDER — OXYCODONE HCL 5 MG PO TABS: 5.0000 mg | ORAL_TABLET | ORAL | Status: DC | PRN

## 2015-03-01 MED ORDER — DEXAMETHASONE SODIUM PHOSPHATE 10 MG/ML IJ SOLN
INTRAMUSCULAR | Status: DC | PRN
  Administered 2015-03-01: 10 mg via INTRAVENOUS

## 2015-03-01 MED ORDER — ONDANSETRON HCL 4 MG/2ML IJ SOLN
INTRAMUSCULAR | Status: AC
  Filled 2015-03-01: qty 2

## 2015-03-01 MED ORDER — KETOROLAC TROMETHAMINE 30 MG/ML IJ SOLN
30.0000 mg | Freq: Once | INTRAMUSCULAR | Status: AC | PRN
  Administered 2015-03-01: 30 mg via INTRAVENOUS

## 2015-03-01 MED ORDER — DEXAMETHASONE SODIUM PHOSPHATE 10 MG/ML IJ SOLN
INTRAMUSCULAR | Status: AC
  Filled 2015-03-01: qty 1

## 2015-03-01 MED ORDER — FENTANYL CITRATE (PF) 250 MCG/5ML IJ SOLN
INTRAMUSCULAR | Status: AC
  Filled 2015-03-01: qty 5

## 2015-03-01 MED ORDER — PROMETHAZINE HCL 25 MG/ML IJ SOLN
6.2500 mg | INTRAMUSCULAR | Status: AC | PRN
  Administered 2015-03-01 (×2): 6.25 mg via INTRAVENOUS

## 2015-03-01 MED ORDER — SUCCINYLCHOLINE CHLORIDE 20 MG/ML IJ SOLN
INTRAMUSCULAR | Status: DC | PRN
  Administered 2015-03-01: 100 mg via INTRAVENOUS

## 2015-03-01 MED ORDER — ACETAMINOPHEN 325 MG PO TABS: 650.0000 mg | ORAL_TABLET | ORAL | Status: DC | PRN

## 2015-03-01 MED ORDER — BUPIVACAINE HCL (PF) 0.5 % IJ SOLN
INTRAMUSCULAR | Status: AC
  Filled 2015-03-01: qty 30

## 2015-03-01 MED ORDER — GLYCOPYRROLATE 0.2 MG/ML IJ SOLN
INTRAMUSCULAR | Status: AC
  Filled 2015-03-01: qty 3

## 2015-03-01 MED ORDER — FENTANYL CITRATE (PF) 100 MCG/2ML IJ SOLN
25.0000 ug | INTRAMUSCULAR | Status: DC | PRN
  Administered 2015-03-01 (×2): 25 ug via INTRAVENOUS
  Administered 2015-03-01: 50 ug via INTRAVENOUS

## 2015-03-01 SURGICAL SUPPLY — 42 items
APL SKNCLS STERI-STRIP NONHPOA (GAUZE/BANDAGES/DRESSINGS) ×1
APPLIER CLIP LOGIC TI 5 (MISCELLANEOUS) ×1 IMPLANT
APR CLP MED LRG 33X5 (MISCELLANEOUS) ×1
BENZOIN TINCTURE PRP APPL 2/3 (GAUZE/BANDAGES/DRESSINGS) ×2 IMPLANT
CABLE HIGH FREQUENCY MONO STRZ (ELECTRODE) ×1 IMPLANT
CHLORAPREP W/TINT 26ML (MISCELLANEOUS) ×2 IMPLANT
DECANTER SPIKE VIAL GLASS SM (MISCELLANEOUS) ×1 IMPLANT
DISSECT BALLN SPACEMKR + OVL (BALLOONS) ×2
DISSECTOR BALLN SPACEMKR + OVL (BALLOONS) ×1 IMPLANT
DISSECTOR BLUNT TIP ENDO 5MM (MISCELLANEOUS) ×1 IMPLANT
DRAPE LAPAROSCOPIC ABDOMINAL (DRAPES) ×2 IMPLANT
DRAPE UTILITY XL STRL (DRAPES) ×2 IMPLANT
DRSG TEGADERM 2-3/8X2-3/4 SM (GAUZE/BANDAGES/DRESSINGS) ×4 IMPLANT
DRSG TEGADERM 4X4.75 (GAUZE/BANDAGES/DRESSINGS) ×1 IMPLANT
ELECT REM PT RETURN 9FT ADLT (ELECTROSURGICAL) ×2
ELECTRODE REM PT RTRN 9FT ADLT (ELECTROSURGICAL) ×1 IMPLANT
GAUZE SPONGE 2X2 8PLY STRL LF (GAUZE/BANDAGES/DRESSINGS) IMPLANT
GLOVE BIOGEL PI IND STRL 7.0 (GLOVE) ×1 IMPLANT
GLOVE BIOGEL PI INDICATOR 7.0 (GLOVE) ×1
GLOVE ECLIPSE 8.0 STRL XLNG CF (GLOVE) ×2 IMPLANT
GLOVE INDICATOR 8.0 STRL GRN (GLOVE) ×4 IMPLANT
GOWN STRL REUS W/TWL LRG LVL3 (GOWN DISPOSABLE) ×2 IMPLANT
GOWN STRL REUS W/TWL XL LVL3 (GOWN DISPOSABLE) ×4 IMPLANT
KIT BASIN OR (CUSTOM PROCEDURE TRAY) ×2 IMPLANT
MARKER SKIN DUAL TIP RULER LAB (MISCELLANEOUS) ×2 IMPLANT
MESH HERNIA 6X6 BARD (Mesh General) IMPLANT
MESH HERNIA BARD 6X6 (Mesh General) ×2 IMPLANT
NDL INSUFFLATION 14GA 120MM (NEEDLE) IMPLANT
NEEDLE INSUFFLATION 14GA 120MM (NEEDLE) ×2 IMPLANT
SCISSORS LAP 5X35 DISP (ENDOMECHANICALS) IMPLANT
SET IRRIG TUBING LAPAROSCOPIC (IRRIGATION / IRRIGATOR) IMPLANT
SOLUTION ANTI FOG 6CC (MISCELLANEOUS) ×1 IMPLANT
SPONGE GAUZE 2X2 STER 10/PKG (GAUZE/BANDAGES/DRESSINGS) ×1
STRIP CLOSURE SKIN 1/2X4 (GAUZE/BANDAGES/DRESSINGS) ×2 IMPLANT
SUT MNCRL AB 4-0 PS2 18 (SUTURE) ×2 IMPLANT
SUT SILK 2 0 SH (SUTURE) ×1 IMPLANT
SUT VICRYL 0 UR6 27IN ABS (SUTURE) ×1 IMPLANT
TACKER 5MM HERNIA 3.5CML NAB (ENDOMECHANICALS) ×1 IMPLANT
TOWEL OR 17X26 10 PK STRL BLUE (TOWEL DISPOSABLE) ×2 IMPLANT
TRAY LAPAROSCOPIC (CUSTOM PROCEDURE TRAY) ×2 IMPLANT
TROCAR CANNULA W/PORT DUAL 5MM (MISCELLANEOUS) ×4 IMPLANT
TUBING INSUFFLATION 10FT LAP (TUBING) ×1 IMPLANT

## 2015-03-01 NOTE — Op Note (Signed)
Operative Note  Samuel NestDaryl D Cruz male 39 y.o. 03/01/2015  PREOPERATIVE DX:  Bilateral inguinal hernias  POSTOPERATIVE DX:  Bilateral direct inguinal hernias  PROCEDURE:   Laparoscopic repair of bilateral inguinal hernias with mesh         Surgeon: Adolph PollackOSENBOWER,Gabriellia Rempel J   Assistants: None  Anesthesia: General endotracheal anesthesia  Indications:   This is an active 39 year old male who developed a painful right inguinal bulge consistent with hernia on exam. He also has a left inguinal hernia on exam. He now presents for the above procedure.    Procedure Detail:  He was seen in the holding area. He voided. He was brought to the operating room placed supine on the operating table and a general anesthetic was given. The hair in the abdominal wall and groin areas was clipped. These areas were then sterilely prepped and draped.  Marcaine was infiltrated into the subcutaneous tissues just inferior to the umbilicus. A small transverse incision was made inferior to the umbilicus. Blunt dissection was used to identify the right anterior rectus sheath.  A small incision was made in the right anterior rectus sheath.  The underlying rectus muscle was swept laterally exposing the posterior rectus sheath. The balloon dissection device was then placed into the extraperitoneal space. Under laparoscopic vision balloon dissection was performed of the extraperitoneal space bilaterally. CO2 gas was insufflated creating a working space. Two 5 mm trocars were then placed through lower midline incisions.  Using blunt dissection, the symphysis pubis was identified. Cooper's ligament was identified bilaterally. A direct hernia sac was noted on the right side containing extraperitoneal fatty tissue. This was bluntly dissected and reduced back into the extraperitoneal space. Using blunt dissection, the anterior and lateral abdominal walls were then freed from the extraperitoneal tissue. The spermatic cord was isolated and  a posterior window created around it. The peritoneum on the spermatic cord was dissected free from the cord back to the level of the anterior superior iliac spine.  Next I approached the left side. Using blunt dissection identified a direct hernia sac and dissected extraperitoneal fatty contents free from it. I then dissected the tissue free from the anterior and lateral abdominal walls. The spermatic cord was identified and isolated. A window was created around it. The peritoneum was fairly densely adherent to the spermatic cord and I reduced this back to the anterior superior iliac spine. We'll hold was made in the peritoneum which was sealed with clips. Because a pneumoperitoneum was created, a Varess needle was inserted through the subumbilical incision to evacuate this.  Two pieces of 15 cm x 15 cm polypropylene mesh are brought onto the field. Two cm was cut off each one. The first piece of mesh was placed into the right extraperitoneal space through the subumbilical trocar. It was deployed. It was then anchored to Cooper's ligament, the anterior down the wall, and the lateral abdominal wall with spiral tacks. This allowed for good coverage with overlap of the direct, indirect, and femoral spaces.  The second piece of mesh was then placed into the left extraperitoneal space, deployed, and then anchored to Cooper's ligament, anterior abdominal wall, and lateral abdominal wall with the spiral tacker. This provided for good coverage with adequate overlap of the direct, indirect, and femoral spaces.  The extraperitoneal space was carefully inspected bilaterally and there is no evidence of bleeding. The inferior aspect of the speech the mesh was then held down in the CO2 gas was released. I observed the extraperitoneal contents approximating  the mesh. Trocars were then removed.  The right anterior rectus sheath defect was closed with a running 0 Vicryl suture. The skin incisions were closed with 4-0  Monocryl subcuticular stitches. Steri-Strips and sterile dressings were applied.  He tolerated the procedure well without any apparent complications and was taken to the recovery room in satisfactory condition.        Complications:  * No complications entered in OR log *         Disposition: PACU - hemodynamically stable.         Condition: stable

## 2015-03-01 NOTE — Interval H&P Note (Signed)
History and Physical Interval Note:  03/01/2015 12:00 PM  Samuel Cruz  has presented today for surgery, with the diagnosis of BILATERL INGUINAL HERNIAS  The various methods of treatment have been discussed with the patient and family. After consideration of risks, benefits and other options for treatment, the patient has consented to  Procedure(s): LAPAROSCOPIC BILATERAL INGUINAL HERNIA REPAIRS WITH MESH (Bilateral) INSERTION OF MESH (Bilateral) as a surgical intervention .  The patient's history has been reviewed, patient examined, no change in status, stable for surgery.  I have reviewed the patient's chart and labs.  Questions were answered to the patient's satisfaction.     [name] JShela Commons

## 2015-03-01 NOTE — Anesthesia Preprocedure Evaluation (Signed)
Anesthesia Evaluation  Patient identified by MRN, date of birth, ID band Patient awake    Reviewed: Allergy & Precautions, NPO status , Patient's Chart, lab work & pertinent test results  Airway Mallampati: II  TM Distance: >3 FB Neck ROM: Full    Dental no notable dental hx.    Pulmonary asthma ,  breath sounds clear to auscultation  Pulmonary exam normal       Cardiovascular negative cardio ROS Normal cardiovascular examRhythm:Regular Rate:Normal     Neuro/Psych negative neurological ROS  negative psych ROS   GI/Hepatic negative GI ROS, Neg liver ROS,   Endo/Other  negative endocrine ROS  Renal/GU negative Renal ROS  negative genitourinary   Musculoskeletal negative musculoskeletal ROS (+)   Abdominal   Peds negative pediatric ROS (+)  Hematology negative hematology ROS (+)   Anesthesia Other Findings   Reproductive/Obstetrics negative OB ROS                             Anesthesia Physical Anesthesia Plan  ASA: II  Anesthesia Plan: General   Post-op Pain Management:    Induction: Intravenous  Airway Management Planned: Oral ETT  Additional Equipment:   Intra-op Plan:   Post-operative Plan: Extubation in OR  Informed Consent: I have reviewed the patients History and Physical, chart, labs and discussed the procedure including the risks, benefits and alternatives for the proposed anesthesia with the patient or authorized representative who has indicated his/her understanding and acceptance.   Dental advisory given  Plan Discussed with: CRNA and Surgeon  Anesthesia Plan Comments:         Anesthesia Quick Evaluation

## 2015-03-01 NOTE — Transfer of Care (Signed)
Immediate Anesthesia Transfer of Care Note  Patient: Samuel Cruz  Procedure(s) Performed: Procedure(s): LAPAROSCOPIC BILATERAL INGUINAL HERNIA REPAIRS WITH MESH (Bilateral) INSERTION OF MESH (Bilateral)  Patient Location: PACU  Anesthesia Type:General  Level of Consciousness:  sedated, patient cooperative and responds to stimulation  Airway & Oxygen Therapy:Patient Spontanous Breathing and Patient connected to face mask oxgen  Post-op Assessment:  Report given to PACU RN and Post -op Vital signs reviewed and stable  Post vital signs:  Reviewed and stable  Last Vitals:  Filed Vitals:   03/01/15 0837  BP: 128/80  Pulse: 53  Temp: 36.8 C  Resp: 18    Complications: No apparent anesthesia complications

## 2015-03-01 NOTE — Anesthesia Procedure Notes (Signed)
Procedure Name: Intubation Date/Time: 03/01/2015 12:35 PM Performed by: Early OsmondEARGLE, Denicia Pagliarulo E Pre-anesthesia Checklist: Patient identified, Emergency Drugs available, Suction available and Patient being monitored Patient Re-evaluated:Patient Re-evaluated prior to inductionOxygen Delivery Method: Circle System Utilized Preoxygenation: Pre-oxygenation with 100% oxygen Intubation Type: IV induction Ventilation: Mask ventilation without difficulty Laryngoscope Size: Mac and 4 Grade View: Grade II Tube type: Oral Tube size: 7.5 mm Number of attempts: 1 Placement Confirmation: ETT inserted through vocal cords under direct vision,  positive ETCO2 and breath sounds checked- equal and bilateral Secured at: 21 cm Tube secured with: Tape Dental Injury: Teeth and Oropharynx as per pre-operative assessment

## 2015-03-01 NOTE — Discharge Instructions (Signed)
[facility] _______[facility]  INGUINAL HERNIA REPAIR: POST OP INSTRUCTIONS  Always review your discharge instruction sheet given to you by the facility where your surgery was performed. IF YOU HAVE DISABILITY OR FAMILY LEAVE FORMS, YOU MUST BRING THEM TO THE OFFICE FOR PROCESSING.   DO NOT GIVE THEM TO YOUR DOCTOR.  1. A  prescription for pain medication may be given to you upon discharge.  Take your pain medication as prescribed, if needed.  If narcotic pain medicine is not needed, then you may take acetaminophen (Tylenol) or ibuprofen (Advil) as needed. 2. Take your usually prescribed medications unless otherwise directed. 3. If you need a refill on your pain medication, please contact your pharmacy.  They will contact our office to request authorization. Prescriptions will not be filled after 5 pm or on week-ends. 4. You should follow a light diet the first 24 hours after arrival home, such as soup and crackers, etc.  Be sure to include lots of fluids daily.  Resume your normal diet the day after surgery. 5. Most patients will experience some swelling and bruising around the umbilicus or in the groin and scrotum.  Ice packs and reclining will help.  Swelling and bruising can take several days to resolve.  6. It is common to experience some constipation if taking pain medication after surgery.  Increasing fluid intake and taking a stool softener (such as Colace) will usually help or prevent this problem from occurring.  A mild laxative (Milk of Magnesia or Miralax) should be taken according to package directions if there are no bowel movements after 48 hours. 7. Unless discharge instructions indicate otherwise, you may remove your bandages 72 hours after surgery.  You may shower the day after surgery.  You may have steri-strips (small skin tapes) in place directly over the incision.  These strips should be left on the skin until they fall off.  If your surgeon used skin glue on the  incision, you may shower in 24 hours.  The glue will flake off over the next 2-3 weeks.  Any sutures or staples will be removed at the office during your follow-up visit. a. ACTIVITIES:  You may resume regular (light) daily activities beginning the next day--such as daily self-care, walking, climbing stairs--gradually increasing activities as tolerated.  You may have sexual intercourse when it is comfortable.  Refrain from any heavy lifting or straining-nothing over 10 pounds for 2 weeks. You may drive when you are no longer taking prescription pain medication, you can comfortably wear a seatbelt, and you can safely maneuver your car and apply brakes. b. RETURN TO WORK:  Desk-light work only in 1-2 weeks.__________________________________________________________ 8. You should see your doctor in the office for a follow-up appointment approximately 2-3 weeks after your surgery.  Make sure that you call for this appointment within a day or two after you arrive home to insure a convenient appointment time. 9. OTHER INSTRUCTIONS:  __________________________________________________________________________________________________________________________________________________________________________________________  WHEN TO CALL YOUR DOCTOR: 1. Fever over 101.0 2. Inability to urinate 3. Nausea and/or vomiting 4. Extreme swelling or bruising 5. Continued bleeding from incision. 6. Increased pain, redness, or drainage from the incision  The clinic staff is available to answer your questions during regular business hours.  Please dont hesitate to call and ask to speak to one of the nurses for clinical concerns.  If you have a medical emergency, go to the nearest emergency room or call 911.  A surgeon from [facility] is always on call  at the hospital   [address] [place], [place], [place]  [address] ?  [address], [place], [place]   [address] [phone] ? [phone] ? FAX  [phone] Web site: [email or web address]

## 2015-03-01 NOTE — Anesthesia Postprocedure Evaluation (Signed)
  Anesthesia Post-op Note  Patient: Samuel NestDaryl D Cruz  Procedure(s) Performed: Procedure(s) (LRB): LAPAROSCOPIC BILATERAL INGUINAL HERNIA REPAIRS WITH MESH (Bilateral) INSERTION OF MESH (Bilateral)  Patient Location: PACU  Anesthesia Type: General  Level of Consciousness: awake and alert   Airway and Oxygen Therapy: Patient Spontanous Breathing  Post-op Pain: mild  Post-op Assessment: Post-op Vital signs reviewed, Patient's Cardiovascular Status Stable, Respiratory Function Stable, Patent Airway and No signs of Nausea or vomiting  Last Vitals:  Filed Vitals:   03/01/15 1546  BP: 118/65  Pulse: 60  Temp: 36.9 C  Resp: 14    Post-op Vital Signs: stable   Complications: No apparent anesthesia complications

## 2015-03-01 NOTE — H&P (Signed)
Samuel Cruz is an 39 y.o. male.   Chief Complaint: Here for elective bilateral inguinal hernia repair HPI: He developed right groin pain and a bulge. He had findings consistent with right inguinal hernia and a smaller left inguinal hernia on exam. He now presents for laparoscopic repair of his bilateral inguinal hernias with mesh.  Past Medical History  Diagnosis Date  . Asthma     No problems now    History reviewed. No pertinent past surgical history.  Family History  Problem Relation Age of Onset  . Adopted: Yes   Social History:  reports that he has never smoked. He has never used smokeless tobacco. He reports that he drinks alcohol. He reports that he does not use illicit drugs.  Allergies: No Known Allergies  Medications Prior to Admission  Medication Sig Dispense Refill  . Ascorbic Acid (VITAMIN C PO) Take 1 tablet by mouth daily as needed (cold symptoms).    Marland Kitchen. ibuprofen (ADVIL,MOTRIN) 200 MG tablet Take 400 mg by mouth every 6 (six) hours as needed for moderate pain.    Marland Kitchen. colchicine 0.6 MG tablet Take 2 initially, then one twice daily for several days, then once daily for several days, then as needed (Patient not taking: Reported on 03/01/2015) 30 tablet 0  . promethazine-codeine (PHENERGAN WITH CODEINE) 6.25-10 MG/5ML syrup Take 5-10 mLs by mouth every 6 (six) hours as needed. (Patient not taking: Reported on 03/01/2015) 120 mL 0    Results for orders placed or performed during the hospital encounter of 03/01/15 (from the past 48 hour(s))  CBC     Status: None   Collection Time: 03/01/15  9:00 AM  Result Value Ref Range   WBC 4.9 4.0 - 10.5 K/uL   RBC 4.60 4.22 - 5.81 MIL/uL   Hemoglobin 14.3 13.0 - 17.0 g/dL   HCT 16.142.0 09.639.0 - 04.552.0 %   MCV 91.3 78.0 - 100.0 fL   MCH 31.1 26.0 - 34.0 pg   MCHC 34.0 30.0 - 36.0 g/dL   RDW 40.912.1 81.111.5 - 91.415.5 %   Platelets 193 150 - 400 K/uL   No results found.  Review of Systems  Constitutional: Negative for fever and chills.   Respiratory: Negative for cough.   Gastrointestinal: Negative for nausea and vomiting.    Blood pressure 128/80, pulse 53, temperature 98.2 F (36.8 C), temperature source Oral, resp. rate 18, height 5\' 11"  (1.803 m), weight 89.585 kg (197 lb 8 oz), SpO2 100 %. Physical Exam  Constitutional: He appears well-developed and well-nourished. No distress.  HENT:  Head: Normocephalic and atraumatic.  GI: Soft.  Genitourinary:  Bilateral inguinal bulges, right greater than left.  Neurological: He is alert.  Skin: Skin is warm and dry.  Psychiatric: He has a normal mood and affect. His behavior is normal.     Assessment/Plan Bilateral inguinal hernias.  Plan: Laparoscopic bilateral inguinal hernia repair with mesh. Procedure, risks, and after care had been discussed with him.  Ronnel Zuercher J 03/01/2015, 11:58 AM

## 2015-03-05 ENCOUNTER — Encounter (HOSPITAL_COMMUNITY): Payer: Self-pay | Admitting: General Surgery

## 2015-09-10 DIAGNOSIS — R42 Dizziness and giddiness: Secondary | ICD-10-CM | POA: Diagnosis not present

## 2015-09-10 DIAGNOSIS — H9311 Tinnitus, right ear: Secondary | ICD-10-CM | POA: Diagnosis not present

## 2016-07-15 ENCOUNTER — Ambulatory Visit (INDEPENDENT_AMBULATORY_CARE_PROVIDER_SITE_OTHER): Payer: 59 | Admitting: Family Medicine

## 2016-07-15 ENCOUNTER — Ambulatory Visit (INDEPENDENT_AMBULATORY_CARE_PROVIDER_SITE_OTHER): Payer: 59

## 2016-07-15 ENCOUNTER — Encounter: Payer: Self-pay | Admitting: Family Medicine

## 2016-07-15 VITALS — BP 100/64 | HR 79 | Temp 100.6°F | Resp 17 | Ht 70.5 in | Wt 184.0 lb

## 2016-07-15 DIAGNOSIS — R509 Fever, unspecified: Secondary | ICD-10-CM | POA: Diagnosis not present

## 2016-07-15 DIAGNOSIS — J181 Lobar pneumonia, unspecified organism: Secondary | ICD-10-CM | POA: Diagnosis not present

## 2016-07-15 DIAGNOSIS — R059 Cough, unspecified: Secondary | ICD-10-CM

## 2016-07-15 DIAGNOSIS — J189 Pneumonia, unspecified organism: Secondary | ICD-10-CM

## 2016-07-15 DIAGNOSIS — R05 Cough: Secondary | ICD-10-CM

## 2016-07-15 MED ORDER — AZITHROMYCIN 250 MG PO TABS: ORAL_TABLET | ORAL | 0 refills | Status: DC

## 2016-07-15 NOTE — Patient Instructions (Addendum)
He was very good to see you today.  You do have a pneumonia based on your chest x-ray and symptoms.  Treatment for this is azithromycin 2 pills today and then 1 pill daily after that for a total of 5 days. This is an antibiotic.  You should start feeling better within the next couple days, about 48 hours after starting the antibiotics.  If you're not feeling better the next 5-7 days come back and see us.  If at any point you start feeling worse do not hesitate to come back immediately.    IF you received an x-ray today, you will receive an invoice from [facility]. Please contact [facility] at [phone] with questions or concerns regarding your invoice.   IF you received labwork today, you will receive an invoice from [facility]. Please contact [facility] at [phone] with questions or concerns regarding your invoice.   Our billing staff will not be able to assist you with questions regarding bills from these companies.  You will be contacted with the lab results as soon as they are available. The fastest way to get your results is to activate your My Chart account. Instructions are located on the last page of this paperwork. If you have not heard from us regarding the results in 2 weeks, please contact this office.

## 2016-07-15 NOTE — Progress Notes (Signed)
   Samuel NestDaryl D Cruz is a 40 y.o. male who presents to [facility] today for cough and fever:  1.  Cough and fever:  Patient states current symptoms started on Monday.  Started having body aches and cough at that point. For the past 3-4 weeks she's had increasing sinus congestion and runny nose. He has not sought treatment because his symptoms allergies. Is been taking over-the-counter antihistamines without much effect. Last week sinus and nasal drainage became thick and mucousy. Previously been clear.  After the body aches started Monday he began having some nausea without overt vomiting yesterday.  Also had one episode of diarrhea yesterday cough is worsened today. He has felt some wheezing when trying to take a deep breath or when coughing.  She's had fevers and chills for the past 2 days as well. He has not measured his temperature. He is taking ibuprofen and Tylenol alternating tablet fevers and body aches. He last took some 3-4 hours before presentation today.  As noted in his vitals he is febrile here despite recent anti-pyretic use  ROS as above.    PMH reviewed. Patient is a nonsmoker.   Past Medical History:  Diagnosis Date  . Asthma    No problems now   Past Surgical History:  Procedure Laterality Date  . INGUINAL HERNIA REPAIR Bilateral 03/01/2015   Procedure: LAPAROSCOPIC BILATERAL INGUINAL HERNIA REPAIRS WITH MESH;  Surgeon: Avel Peaceodd Rosenbower, MD;  Location: [facility];  Service: General;  Laterality: Bilateral;  . INSERTION OF MESH Bilateral 03/01/2015   Procedure: INSERTION OF MESH;  Surgeon: Avel Peaceodd Rosenbower, MD;  Location: [facility];  Service: General;  Laterality: Bilateral;    Medications reviewed. Current Outpatient Prescriptions  Medication Sig Dispense Refill  . Ascorbic Acid (VITAMIN C PO) Take 1 tablet by mouth daily as needed (cold symptoms).    Marland Kitchen. ibuprofen (ADVIL,MOTRIN) 200 MG tablet Take 400 mg by mouth every 6 (six) hours as needed for moderate pain.       No current facility-administered medications for this visit.      Physical Exam:  BP 100/64 (BP Location: Right Arm, Patient Position: Sitting, Cuff Size: Normal)   Pulse 79   Temp (!) 100.6 F (38.1 C) (Oral)   Resp 17   Ht 5' 10.5" (1.791 m)   Wt 184 lb (83.5 kg)   SpO2 97%   BMI 26.03 kg/m  Gen:  Patient sitting on exam table, appears stated age in no acute distress Head: Normocephalic atraumatic Eyes: EOMI, PERRL, sclera and conjunctiva non-erythematous Ears:  Canals clear bilaterally.  TMs pearly gray bilaterally without erythema or bulging.   Nose:  Nasal turbinates grossly enlarged bilaterally. Some exudates noted. Tender to palpation of maxillary sinus  Mouth: Mucosa membranes moist. Tonsils +2, nonenlarged, non-erythematous. Neck: No cervical lymphadenopathy noted Heart:  RRR, no murmurs auscultated. Pulm:  Good air movement. He does have focal wheezing that does not clear with coughing in his left lower lung base.     Assessment and Plan:  1.  Right lower lobe PNA: - wheezing in Left lower lobe more likely bronchitis changes - PNA found on radiograph - treating as mycoplasma/CAP - Azithromycin x 5 days.  - Follow-up in one week if no improvement. -Follow up sooner if worsening.

## 2016-09-22 ENCOUNTER — Ambulatory Visit (INDEPENDENT_AMBULATORY_CARE_PROVIDER_SITE_OTHER): Payer: 59 | Admitting: Physician Assistant

## 2016-09-22 VITALS — BP 112/66 | HR 70 | Temp 97.9°F | Resp 18 | Ht 70.5 in | Wt 182.0 lb

## 2016-09-22 DIAGNOSIS — R69 Illness, unspecified: Secondary | ICD-10-CM

## 2016-09-22 DIAGNOSIS — Z20828 Contact with and (suspected) exposure to other viral communicable diseases: Secondary | ICD-10-CM | POA: Diagnosis not present

## 2016-09-22 DIAGNOSIS — J111 Influenza due to unidentified influenza virus with other respiratory manifestations: Secondary | ICD-10-CM

## 2016-09-22 MED ORDER — OSELTAMIVIR PHOSPHATE 75 MG PO CAPS: 75.0000 mg | ORAL_CAPSULE | Freq: Two times a day (BID) | ORAL | 0 refills | Status: DC

## 2016-09-22 NOTE — Patient Instructions (Signed)
     IF you received an x-ray today, you will receive an invoice from [facility]. Please contact [facility] at [phone] with questions or concerns regarding your invoice.   IF you received labwork today, you will receive an invoice from [facility]. Please contact [facility] at [phone] with questions or concerns regarding your invoice.   Our billing staff will not be able to assist you with questions regarding bills from these companies.  You will be contacted with the lab results as soon as they are available. The fastest way to get your results is to activate your My Chart account. Instructions are located on the last page of this paperwork. If you have not heard from us regarding the results in 2 weeks, please contact this office.     

## 2016-09-22 NOTE — Progress Notes (Signed)
09/22/2016 2:14 PM   DOB: December 17, 1975 / MRN: [id number]  SUBJECTIVE:  WARD BOISSONNEAULT is a 41 y.o. male presenting for HA, sorethroat, myalgia.  He has not taken any medication. 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  Entire family is being treated for the flu at this time as two children have had positives. His chart reports a history of asthma however the patient denies this, reporting he had bronchitis at one point and was diagnosed with asthma, however has never had ashtma as a child and has never needed any of those treatments.   He has No Known Allergies.   He  has a past medical history of Asthma.    He  reports that he has never smoked. He has never used smokeless tobacco. He reports that he drinks alcohol. He reports that he does not use drugs. He  has no sexual activity history on file. The patient  has a past surgical history that includes Inguinal hernia repair (Bilateral, 03/01/2015) and Insertion of mesh (Bilateral, 03/01/2015).  His family history is not on file. He was adopted.  Review of Systems  Constitutional: Positive for malaise/fatigue. Negative for fever.  HENT: Negative for sore throat.   Eyes: Negative.   Respiratory: Positive for cough. Negative for shortness of breath and wheezing.   Cardiovascular: Negative for chest pain.  Gastrointestinal: Negative for nausea.  Musculoskeletal: Positive for myalgias.  Skin: Negative for rash.  Neurological: Positive for headaches. Negative for dizziness.    The problem list and medications were reviewed and updated by myself where necessary and exist elsewhere in the encounter.   OBJECTIVE:  BP 112/66 (BP Location: Right Arm, Patient Position: Sitting, Cuff Size: Small)   Pulse 70   Temp 97.9 F (36.6 C) (Oral)   Resp 18   Ht 5' 10.5" (1.791 m)   Wt 182 lb (82.6 kg)   SpO2 100%   BMI 25.75 kg/m   Physical Exam  Constitutional: He is oriented to person, place, and time. He appears well-developed and  well-nourished. No distress.  HENT:  Right Ear: Tympanic membrane normal.  Left Ear: Tympanic membrane normal.  Nose: Nose normal.  Mouth/Throat: Uvula is midline and mucous membranes are normal. Posterior oropharyngeal erythema present. No tonsillar abscesses.  Cardiovascular: Normal rate and regular rhythm.   Pulmonary/Chest: Effort normal and breath sounds normal.  Musculoskeletal: Normal range of motion.  Neurological: He is alert and oriented to person, place, and time. No cranial nerve deficit.  Skin: Skin is warm and dry. He is not diaphoretic.  Psychiatric: He has a normal mood and affect.  Vitals reviewed.   No results found for this or any previous visit (from the past 72 hour(s)).  No results found.  ASSESSMENT AND PLAN:  Dequarius was seen today for sore throat, cough and generalized body aches.  Diagnoses and all orders for this visit:  Influenza-like illness Comments: Entire family being treated at this time.  This illness started today and is worsening by the minute.  Will initiate treatment.  Orders: -     oseltamivir (TAMIFLU) 75 MG capsule; Take 1 capsule (75 mg total) by mouth 2 (two) times daily.  Exposure to influenza    The patient is advised to call or return to clinic if he does not see an improvement in symptoms, or to seek the care of the closest emergency department if he worsens with the above plan.   Deliah Boston, MHS, PA-C [facility] 09/22/2016 2:14 PM

## 2017-06-22 ENCOUNTER — Encounter: Payer: Self-pay | Admitting: Emergency Medicine

## 2017-06-22 ENCOUNTER — Ambulatory Visit (INDEPENDENT_AMBULATORY_CARE_PROVIDER_SITE_OTHER): Payer: 59 | Admitting: Emergency Medicine

## 2017-06-22 VITALS — BP 100/60 | HR 45 | Temp 97.7°F | Resp 16 | Ht 70.0 in | Wt 167.8 lb

## 2017-06-22 DIAGNOSIS — J069 Acute upper respiratory infection, unspecified: Secondary | ICD-10-CM | POA: Diagnosis not present

## 2017-06-22 DIAGNOSIS — J3489 Other specified disorders of nose and nasal sinuses: Secondary | ICD-10-CM | POA: Diagnosis not present

## 2017-06-22 DIAGNOSIS — J029 Acute pharyngitis, unspecified: Secondary | ICD-10-CM | POA: Diagnosis not present

## 2017-06-22 MED ORDER — AZITHROMYCIN 250 MG PO TABS: ORAL_TABLET | ORAL | 0 refills | Status: DC

## 2017-06-22 NOTE — Patient Instructions (Addendum)
     IF you received an x-ray today, you will receive an invoice from [facility]. Please contact [facility] at [phone] with questions or concerns regarding your invoice.   IF you received labwork today, you will receive an invoice from [facility]. Please contact [facility] at [phone] with questions or concerns regarding your invoice.   Our billing staff will not be able to assist you with questions regarding bills from these companies.  You will be contacted with the lab results as soon as they are available. The fastest way to get your results is to activate your My Chart account. Instructions are located on the last page of this paperwork. If you have not heard from us regarding the results in 2 weeks, please contact this office.     Upper Respiratory Infection, Adult Most upper respiratory infections (URIs) are caused by a virus. A URI affects the nose, throat, and upper air passages. The most common type of URI is often called "the common cold." Follow these instructions at home:  Take medicines only as told by your doctor.  Gargle warm saltwater or take cough drops to comfort your throat as told by your doctor.  Use a warm mist humidifier or inhale steam from a shower to increase air moisture. This may make it easier to breathe.  Drink enough fluid to keep your pee (urine) clear or pale yellow.  Eat soups and other clear broths.  Have a healthy diet.  Rest as needed.  Go back to work when your fever is gone or your doctor says it is okay. ? You may need to stay home longer to avoid giving your URI to others. ? You can also wear a face mask and wash your hands often to prevent spread of the virus.  Use your inhaler more if you have asthma.  Do not use any tobacco products, including cigarettes, chewing tobacco, or electronic cigarettes. If you need help quitting, ask your doctor. Contact a doctor if:  You are getting worse, not better.  Your  symptoms are not helped by medicine.  You have chills.  You are getting more short of breath.  You have brown or red mucus.  You have yellow or brown discharge from your nose.  You have pain in your face, especially when you bend forward.  You have a fever.  You have puffy (swollen) neck glands.  You have pain while swallowing.  You have white areas in the back of your throat. Get help right away if:  You have very bad or constant: ? Headache. ? Ear pain. ? Pain in your forehead, behind your eyes, and over your cheekbones (sinus pain). ? Chest pain.  You have long-lasting (chronic) lung disease and any of the following: ? Wheezing. ? Long-lasting cough. ? Coughing up blood. ? A change in your usual mucus.  You have a stiff neck.  You have changes in your: ? Vision. ? Hearing. ? Thinking. ? Mood. This information is not intended to replace advice given to you by your health care provider. Make sure you discuss any questions you have with your health care provider. Document Released: 02/03/2008 Document Revised: 04/19/2016 Document Reviewed: 11/22/2013 Elsevier Interactive Patient Education  2018 Elsevier Inc.  

## 2017-06-22 NOTE — Progress Notes (Signed)
Samuel Cruz 41 y.o.   Chief Complaint  Patient presents with  . Cough    per patient not much - ALL started yesterday  . Sore Throat    slight    HISTORY OF PRESENT ILLNESS: This is a 41 y.o. male complaining of cough and sore throat. Works at [employer] and co-worker sick with bronchitis.  HPI   Prior to Admission medications   Medication Sig Start Date End Date Taking? Authorizing Provider  ibuprofen (ADVIL,MOTRIN) 200 MG tablet Take 400 mg by mouth every 6 (six) hours as needed for moderate pain.   Yes [provider]    No Known Allergies  There are no active problems to display for this patient.   Past Medical History:  Diagnosis Date  . Asthma    No problems now    Past Surgical History:  Procedure Laterality Date  . INGUINAL HERNIA REPAIR Bilateral 03/01/2015   Procedure: LAPAROSCOPIC BILATERAL INGUINAL HERNIA REPAIRS WITH MESH;  Surgeon: Avel Peace, MD;  Location: [facility];  Service: General;  Laterality: Bilateral;  . INSERTION OF MESH Bilateral 03/01/2015   Procedure: INSERTION OF MESH;  Surgeon: Avel Peace, MD;  Location: [facility];  Service: General;  Laterality: Bilateral;    Social History   Social History  . Marital status: Married    Spouse name: N/A  . Number of children: N/A  . Years of education: N/A   Occupational History  . Not on file.   Social History Main Topics  . Smoking status: Never Smoker  . Smokeless tobacco: Never Used  . Alcohol use Yes     Comment: Rare  . Drug use: No  . Sexual activity: Not on file   Other Topics Concern  . Not on file   Social History Narrative  . No narrative on file    Family History  Problem Relation Age of Onset  . Adopted: Yes     Review of Systems  Constitutional: Positive for malaise/fatigue. Negative for chills and fever.  HENT: Positive for congestion and sore throat.   Eyes: Negative for discharge and redness.  Respiratory: Positive for cough. Negative for  shortness of breath.   Cardiovascular: Negative for chest pain and palpitations.  Gastrointestinal: Negative for abdominal pain, diarrhea, nausea and vomiting.  Genitourinary: Negative for dysuria and hematuria.  Musculoskeletal: Positive for back pain and myalgias.  Skin: Negative for rash.  Neurological: Positive for weakness.  Endo/Heme/Allergies: Negative.   Psychiatric/Behavioral: Negative.   All other systems reviewed and are negative.  Vitals:   06/22/17 1348  BP: 100/60  Pulse: (!) 45  Resp: 16  Temp: 97.7 F (36.5 C)  SpO2: 99%      Physical Exam  Constitutional: He is oriented to person, place, and time. He appears well-developed and well-nourished.  HENT:  Head: Normocephalic and atraumatic.  Right Ear: Tympanic membrane, external ear and ear canal normal.  Left Ear: Tympanic membrane, external ear and ear canal normal.  Nose: Nose normal.  Mouth/Throat: Oropharynx is clear and moist.  Eyes: Pupils are equal, round, and reactive to light. Conjunctivae and EOM are normal.  Neck: Normal range of motion. Neck supple. No JVD present.  Cardiovascular: Normal rate, regular rhythm and normal heart sounds.   Pulmonary/Chest: Effort normal and breath sounds normal.  Abdominal: Soft. Bowel sounds are normal. There is no tenderness.  Musculoskeletal: Normal range of motion.  Lymphadenopathy:    He has no cervical adenopathy.  Neurological: He is alert and oriented  to person, place, and time. No sensory deficit. He exhibits normal muscle tone.  Skin: Skin is warm and dry. No rash noted.  Psychiatric: He has a normal mood and affect. His behavior is normal.  Vitals reviewed.    ASSESSMENT & PLAN: Samuel Cruz was seen today for cough and sore throat.  Diagnoses and all orders for this visit:  Acute upper respiratory infection  Sinus pressure  Sore throat  Other orders -     azithromycin (ZITHROMAX) 250 MG tablet; Sig as indicated    Patient Instructions        IF you received an x-ray today, you will receive an invoice from [facility]. Please contact [facility] at [phone] with questions or concerns regarding your invoice.   IF you received labwork today, you will receive an invoice from [facility]. Please contact [facility] at [phone] with questions or concerns regarding your invoice.   Our billing staff will not be able to assist you with questions regarding bills from these companies.  You will be contacted with the lab results as soon as they are available. The fastest way to get your results is to activate your My Chart account. Instructions are located on the last page of this paperwork. If you have not heard from [place] regarding the results in 2 weeks, please contact this office.     Upper Respiratory Infection, Adult Most upper respiratory infections (URIs) are caused by a virus. A URI affects the nose, throat, and upper air passages. The most common type of URI is often called "the common cold." Follow these instructions at home:  Take medicines only as told by your doctor.  Gargle warm saltwater or take cough drops to comfort your throat as told by your doctor.  Use a warm mist humidifier or inhale steam from a shower to increase air moisture. This may make it easier to breathe.  Drink enough fluid to keep your pee (urine) clear or pale yellow.  Eat soups and other clear broths.  Have a healthy diet.  Rest as needed.  Go back to work when your fever is gone or your doctor says it is okay. ? You may need to stay home longer to avoid giving your URI to others. ? You can also wear a face mask and wash your hands often to prevent spread of the virus.  Use your inhaler more if you have asthma.  Do not use any tobacco products, including cigarettes, chewing tobacco, or electronic cigarettes. If you need help quitting, ask your doctor. Contact a doctor if:  You are getting worse, not better.  Your  symptoms are not helped by medicine.  You have chills.  You are getting more short of breath.  You have brown or red mucus.  You have yellow or brown discharge from your nose.  You have pain in your face, especially when you bend forward.  You have a fever.  You have puffy (swollen) neck glands.  You have pain while swallowing.  You have white areas in the back of your throat. Get help right away if:  You have very bad or constant: ? Headache. ? Ear pain. ? Pain in your forehead, behind your eyes, and over your cheekbones (sinus pain). ? Chest pain.  You have long-lasting (chronic) lung disease and any of the following: ? Wheezing. ? Long-lasting cough. ? Coughing up blood. ? A change in your usual mucus.  You have a stiff neck.  You have changes in your: ? Vision. ?  Hearing. ? Thinking. ? Mood. This information is not intended to replace advice given to you by your health care provider. Make sure you discuss any questions you have with your health care provider. Document Released: 02/03/2008 Document Revised: 04/19/2016 Document Reviewed: 11/22/2013 Elsevier Interactive Patient Education  2018 Elsevier Inc.      Edwina BarthMiguel Falisha Osment, MD [facility]

## 2017-10-08 ENCOUNTER — Ambulatory Visit (INDEPENDENT_AMBULATORY_CARE_PROVIDER_SITE_OTHER): Payer: 59 | Admitting: Emergency Medicine

## 2017-10-08 ENCOUNTER — Encounter: Payer: Self-pay | Admitting: Emergency Medicine

## 2017-10-08 ENCOUNTER — Other Ambulatory Visit: Payer: Self-pay

## 2017-10-08 VITALS — BP 104/56 | HR 65 | Temp 98.1°F | Resp 16 | Ht 70.5 in | Wt 169.8 lb

## 2017-10-08 DIAGNOSIS — B349 Viral infection, unspecified: Secondary | ICD-10-CM | POA: Diagnosis not present

## 2017-10-08 DIAGNOSIS — J029 Acute pharyngitis, unspecified: Secondary | ICD-10-CM | POA: Diagnosis not present

## 2017-10-08 DIAGNOSIS — R0981 Nasal congestion: Secondary | ICD-10-CM

## 2017-10-08 MED ORDER — AZITHROMYCIN 250 MG PO TABS: ORAL_TABLET | ORAL | 0 refills | Status: DC

## 2017-10-08 MED ORDER — PSEUDOEPHEDRINE-GUAIFENESIN ER 60-600 MG PO TB12: 1.0000 | ORAL_TABLET | Freq: Two times a day (BID) | ORAL | 1 refills | Status: AC

## 2017-10-08 MED ORDER — PREDNISONE 20 MG PO TABS: 40.0000 mg | ORAL_TABLET | Freq: Every day | ORAL | 0 refills | Status: AC

## 2017-10-08 NOTE — Patient Instructions (Addendum)
   IF you received an x-ray today, you will receive an invoice from [facility]. Please contact [facility] at [phone] with questions or concerns regarding your invoice.   IF you received labwork today, you will receive an invoice from [facility]. Please contact [facility] at [phone] with questions or concerns regarding your invoice.   Our billing staff will not be able to assist you with questions regarding bills from these companies.  You will be contacted with the lab results as soon as they are available. The fastest way to get your results is to activate your My Chart account. Instructions are located on the last page of this paperwork. If you have not heard from us regarding the results in 2 weeks, please contact this office.     Viral Illness, Adult Viruses are tiny germs that can get into a person's body and cause illness. There are many different types of viruses, and they cause many types of illness. Viral illnesses can range from mild to severe. They can affect various parts of the body. Common illnesses that are caused by a virus include colds and the flu. Viral illnesses also include serious conditions such as HIV/AIDS (human immunodeficiency virus/acquired immunodeficiency syndrome). A few viruses have been linked to certain cancers. What are the causes? Many types of viruses can cause illness. Viruses invade cells in your body, multiply, and cause the infected cells to malfunction or die. When the cell dies, it releases more of the virus. When this happens, you develop symptoms of the illness, and the virus continues to spread to other cells. If the virus takes over the function of the cell, it can cause the cell to divide and grow out of control, as is the case when a virus causes cancer. Different viruses get into the body in different ways. You can get a virus by:  Swallowing food or water that is contaminated with the virus.  Breathing in droplets  that have been coughed or sneezed into the air by an infected person.  Touching a surface that has been contaminated with the virus and then touching your eyes, nose, or mouth.  Being bitten by an insect or animal that carries the virus.  Having sexual contact with a person who is infected with the virus.  Being exposed to blood or fluids that contain the virus, either through an open cut or during a transfusion.  If a virus enters your body, your body's defense system (immune system) will try to fight the virus. You may be at higher risk for a viral illness if your immune system is weak. What are the signs or symptoms? Symptoms vary depending on the type of virus and the location of the cells that it invades. Common symptoms of the main types of viral illnesses include: Cold and flu viruses  Fever.  Headache.  Sore throat.  Muscle aches.  Nasal congestion.  Cough. Digestive system (gastrointestinal) viruses  Fever.  Abdominal pain.  Nausea.  Diarrhea. Liver viruses (hepatitis)  Loss of appetite.  Tiredness.  Yellowing of the skin (jaundice). Brain and spinal cord viruses  Fever.  Headache.  Stiff neck.  Nausea and vomiting.  Confusion or sleepiness. Skin viruses  Warts.  Itching.  Rash. Sexually transmitted viruses  Discharge.  Swelling.  Redness.  Rash. How is this treated? Viruses can be difficult to treat because they live within cells. Antibiotic medicines do not treat viruses because these drugs do not get inside cells. Treatment for a viral illness   may include:  Resting and drinking plenty of fluids.  Medicines to relieve symptoms. These can include over-the-counter medicine for pain and fever, medicines for cough or congestion, and medicines to relieve diarrhea.  Antiviral medicines. These drugs are available only for certain types of viruses. They may help reduce flu symptoms if taken early. There are also many antiviral medicines  for hepatitis and HIV/AIDS.  Some viral illnesses can be prevented with vaccinations. A common example is the flu shot. Follow these instructions at home: Medicines   Take over-the-counter and prescription medicines only as told by your health care provider.  If you were prescribed an antiviral medicine, take it as told by your health care provider. Do not stop taking the medicine even if you start to feel better.  Be aware of when antibiotics are needed and when they are not needed. Antibiotics do not treat viruses. If your health care provider thinks that you may have a bacterial infection as well as a viral infection, you may get an antibiotic. ? Do not ask for an antibiotic prescription if you have been diagnosed with a viral illness. That will not make your illness go away faster. ? Frequently taking antibiotics when they are not needed can lead to antibiotic resistance. When this develops, the medicine no longer works against the bacteria that it normally fights. General instructions  Drink enough fluids to keep your urine clear or pale yellow.  Rest as much as possible.  Return to your normal activities as told by your health care provider. Ask your health care provider what activities are safe for you.  Keep all follow-up visits as told by your health care provider. This is important. How is this prevented? Take these actions to reduce your risk of viral infection:  Eat a healthy diet and get enough rest.  Wash your hands often with soap and water. This is especially important when you are in public places. If soap and water are not available, use hand sanitizer.  Avoid close contact with friends and family who have a viral illness.  If you travel to areas where viral gastrointestinal infection is common, avoid drinking water or eating raw food.  Keep your immunizations up to date. Get a flu shot every year as told by your health care provider.  Do not share toothbrushes,  nail clippers, razors, or needles with other people.  Always practice safe sex.  Contact a health care provider if:  You have symptoms of a viral illness that do not go away.  Your symptoms come back after going away.  Your symptoms get worse. Get help right away if:  You have trouble breathing.  You have a severe headache or a stiff neck.  You have severe vomiting or abdominal pain. This information is not intended to replace advice given to you by your health care provider. Make sure you discuss any questions you have with your health care provider. Document Released: 12/27/2015 Document Revised: 01/29/2016 Document Reviewed: 12/27/2015 Elsevier Interactive Patient Education  2018 Elsevier Inc.  

## 2017-10-08 NOTE — Progress Notes (Signed)
Samuel Cruz 42 y.o.   Chief Complaint  Patient presents with  . Fatigue    x 4-5 days  . Headache    and sorethroat    HISTORY OF PRESENT ILLNESS: This is a 42 y.o. male complaining of 4-5-day history of feeling tired fatigue with a sore throat and headache.  Also, complaining of sinus congestion.  No other significant symptoms.  HPI   Prior to Admission medications   Medication Sig Start Date End Date Taking? Authorizing Provider  ibuprofen (ADVIL,MOTRIN) 200 MG tablet Take 400 mg by mouth every 6 (six) hours as needed for moderate pain.   Yes [provider]  azithromycin (ZITHROMAX) 250 MG tablet Sig as indicated Patient not taking: Reported on 10/08/2017 06/22/17   Georgina Quint, MD    No Known Allergies  Patient Active Problem List   Diagnosis Date Noted  . Acute upper respiratory infection 06/22/2017  . Sinus pressure 06/22/2017  . Sore throat 06/22/2017    Past Medical History:  Diagnosis Date  . Asthma    No problems now    Past Surgical History:  Procedure Laterality Date  . INGUINAL HERNIA REPAIR Bilateral 03/01/2015   Procedure: LAPAROSCOPIC BILATERAL INGUINAL HERNIA REPAIRS WITH MESH;  Surgeon: Avel Peace, MD;  Location: [facility];  Service: General;  Laterality: Bilateral;  . INSERTION OF MESH Bilateral 03/01/2015   Procedure: INSERTION OF MESH;  Surgeon: Avel Peace, MD;  Location: [facility];  Service: General;  Laterality: Bilateral;    Social History   Socioeconomic History  . Marital status: Married    Spouse name: Not on file  . Number of children: Not on file  . Years of education: Not on file  . Highest education level: Not on file  Social Needs  . Financial resource strain: Not on file  . Food insecurity - worry: Not on file  . Food insecurity - inability: Not on file  . Transportation needs - medical: Not on file  . Transportation needs - non-medical: Not on file  Occupational History  . Not on file  Tobacco Use    . Smoking status: Never Smoker  . Smokeless tobacco: Never Used  Substance and Sexual Activity  . Alcohol use: Yes    Comment: Rare  . Drug use: No  . Sexual activity: Not on file  Other Topics Concern  . Not on file  Social History Narrative  . Not on file    Family History  Adopted: Yes     Review of Systems  Constitutional: Positive for chills. Negative for fever.  HENT: Positive for congestion.   Eyes: Negative.  Negative for discharge and redness.  Respiratory: Negative.  Negative for cough and shortness of breath.   Cardiovascular: Negative.  Negative for chest pain and palpitations.  Gastrointestinal: Negative.  Negative for abdominal pain, diarrhea, nausea and vomiting.  Genitourinary: Negative.  Negative for dysuria and hematuria.  Musculoskeletal: Negative for back pain, myalgias and neck pain.  Skin: Negative.  Negative for rash.  Neurological: Positive for headaches.  Endo/Heme/Allergies: Negative.   All other systems reviewed and are negative.  Vitals:   10/08/17 1540  BP: (!) 104/56  Pulse: 65  Resp: 16  Temp: 98.1 F (36.7 C)  SpO2: 99%     Physical Exam  Constitutional: He is oriented to person, place, and time. He appears well-developed and well-nourished.  HENT:  Head: Normocephalic and atraumatic.  Right Ear: External ear normal.  Left Ear: External ear normal.  Nose: Nose normal.  Mouth/Throat: Oropharynx is clear and moist.  Eyes: Conjunctivae and EOM are normal. Pupils are equal, round, and reactive to light.  Neck: Normal range of motion. Neck supple. No thyromegaly present.  Cardiovascular: Normal rate, regular rhythm and normal heart sounds.  Pulmonary/Chest: Effort normal and breath sounds normal.  Abdominal: Soft. Bowel sounds are normal. He exhibits no distension. There is no tenderness.  Musculoskeletal: Normal range of motion. He exhibits no edema.  Lymphadenopathy:    He has no cervical adenopathy.  Neurological: He is alert  and oriented to person, place, and time. No sensory deficit. He exhibits normal muscle tone.  Skin: Skin is warm and dry. Capillary refill takes less than 2 seconds. No rash noted.  Psychiatric: He has a normal mood and affect. His behavior is normal.  Vitals reviewed.    ASSESSMENT & PLAN: Samuel RothmanDaryl was seen today for fatigue and headache.  Diagnoses and all orders for this visit:  Viral illness  Sinus congestion -     predniSONE (DELTASONE) 20 MG tablet; Take 2 tablets (40 mg total) by mouth daily with breakfast for 5 days. -     pseudoephedrine-guaifenesin (MUCINEX D) 60-600 MG 12 hr tablet; Take 1 tablet by mouth every 12 (twelve) hours for 5 days.  Sore throat -     azithromycin (ZITHROMAX) 250 MG tablet; Sig as indicated    Patient Instructions       IF you received an x-ray today, you will receive an invoice from [facility]. Please contact [facility] at [phone] with questions or concerns regarding your invoice.   IF you received labwork today, you will receive an invoice from [facility]. Please contact [facility] at [phone] with questions or concerns regarding your invoice.   Our billing staff will not be able to assist you with questions regarding bills from these companies.  You will be contacted with the lab results as soon as they are available. The fastest way to get your results is to activate your My Chart account. Instructions are located on the last page of this paperwork. If you have not heard from us regarding the results in 2 weeks, please contact this office.     Viral Illness, Adult Viruses are tiny germs that can get into a person's body and cause illness. There are many different types of viruses, and they cause many types of illness. Viral illnesses can range from mild to severe. They can affect various parts of the body. Common illnesses that are caused by a virus include colds and the flu. Viral illnesses also include serious  conditions such as HIV/AIDS (human immunodeficiency virus/acquired immunodeficiency syndrome). A few viruses have been linked to certain cancers. What are the causes? Many types of viruses can cause illness. Viruses invade cells in your body, multiply, and cause the infected cells to malfunction or die. When the cell dies, it releases more of the virus. When this happens, you develop symptoms of the illness, and the virus continues to spread to other cells. If the virus takes over the function of the cell, it can cause the cell to divide and grow out of control, as is the case when a virus causes cancer. Different viruses get into the body in different ways. You can get a virus by:  Swallowing food or water that is contaminated with the virus.  Breathing in droplets that have been coughed or sneezed into the air by an infected person.  Touching a surface that has been contaminated  with the virus and then touching your eyes, nose, or mouth.  Being bitten by an insect or animal that carries the virus.  Having sexual contact with a person who is infected with the virus.  Being exposed to blood or fluids that contain the virus, either through an open cut or during a transfusion.  If a virus enters your body, your body's defense system (immune system) will try to fight the virus. You may be at higher risk for a viral illness if your immune system is weak. What are the signs or symptoms? Symptoms vary depending on the type of virus and the location of the cells that it invades. Common symptoms of the main types of viral illnesses include: Cold and flu viruses  Fever.  Headache.  Sore throat.  Muscle aches.  Nasal congestion.  Cough. Digestive system (gastrointestinal) viruses  Fever.  Abdominal pain.  Nausea.  Diarrhea. Liver viruses (hepatitis)  Loss of appetite.  Tiredness.  Yellowing of the skin (jaundice). Brain and spinal cord viruses  Fever.  Headache.  Stiff  neck.  Nausea and vomiting.  Confusion or sleepiness. Skin viruses  Warts.  Itching.  Rash. Sexually transmitted viruses  Discharge.  Swelling.  Redness.  Rash. How is this treated? Viruses can be difficult to treat because they live within cells. Antibiotic medicines do not treat viruses because these drugs do not get inside cells. Treatment for a viral illness may include:  Resting and drinking plenty of fluids.  Medicines to relieve symptoms. These can include over-the-counter medicine for pain and fever, medicines for cough or congestion, and medicines to relieve diarrhea.  Antiviral medicines. These drugs are available only for certain types of viruses. They may help reduce flu symptoms if taken early. There are also many antiviral medicines for hepatitis and HIV/AIDS.  Some viral illnesses can be prevented with vaccinations. A common example is the flu shot. Follow these instructions at home: Medicines   Take over-the-counter and prescription medicines only as told by your health care provider.  If you were prescribed an antiviral medicine, take it as told by your health care provider. Do not stop taking the medicine even if you start to feel better.  Be aware of when antibiotics are needed and when they are not needed. Antibiotics do not treat viruses. If your health care provider thinks that you may have a bacterial infection as well as a viral infection, you may get an antibiotic. ? Do not ask for an antibiotic prescription if you have been diagnosed with a viral illness. That will not make your illness go away faster. ? Frequently taking antibiotics when they are not needed can lead to antibiotic resistance. When this develops, the medicine no longer works against the bacteria that it normally fights. General instructions  Drink enough fluids to keep your urine clear or pale yellow.  Rest as much as possible.  Return to your normal activities as told by your  health care provider. Ask your health care provider what activities are safe for you.  Keep all follow-up visits as told by your health care provider. This is important. How is this prevented? Take these actions to reduce your risk of viral infection:  Eat a healthy diet and get enough rest.  Wash your hands often with soap and water. This is especially important when you are in public places. If soap and water are not available, use hand sanitizer.  Avoid close contact with friends and family who have a viral illness.  If you travel to areas where viral gastrointestinal infection is common, avoid drinking water or eating raw food.  Keep your immunizations up to date. Get a flu shot every year as told by your health care provider.  Do not share toothbrushes, nail clippers, razors, or needles with other people.  Always practice safe sex.  Contact a health care provider if:  You have symptoms of a viral illness that do not go away.  Your symptoms come back after going away.  Your symptoms get worse. Get help right away if:  You have trouble breathing.  You have a severe headache or a stiff neck.  You have severe vomiting or abdominal pain. This information is not intended to replace advice given to you by your health care provider. Make sure you discuss any questions you have with your health care provider. Document Released: 12/27/2015 Document Revised: 01/29/2016 Document Reviewed: 12/27/2015 Elsevier Interactive Patient Education  2018 Elsevier Inc.      Edwina Barth, MD [facility]

## 2019-08-28 ENCOUNTER — Encounter (HOSPITAL_COMMUNITY): Payer: Self-pay | Admitting: Emergency Medicine

## 2019-08-28 ENCOUNTER — Emergency Department (HOSPITAL_COMMUNITY): Payer: 59

## 2019-08-28 ENCOUNTER — Emergency Department (HOSPITAL_COMMUNITY)
Admission: EM | Admit: 2019-08-28 | Discharge: 2019-08-28 | Discharge status: Against Medical Advice | Payer: 59 | Attending: Emergency Medicine | Admitting: Emergency Medicine

## 2019-08-28 ENCOUNTER — Other Ambulatory Visit: Payer: Self-pay

## 2019-08-28 DIAGNOSIS — R079 Chest pain, unspecified: Secondary | ICD-10-CM | POA: Diagnosis not present

## 2019-08-28 DIAGNOSIS — R0789 Other chest pain: Secondary | ICD-10-CM | POA: Diagnosis present

## 2019-08-28 DIAGNOSIS — Z5321 Procedure and treatment not carried out due to patient leaving prior to being seen by health care provider: Secondary | ICD-10-CM | POA: Insufficient documentation

## 2019-08-28 HISTORY — DX: COVID-19: U07.1

## 2019-08-28 LAB — BASIC METABOLIC PANEL
Anion gap: 8 (ref 5–15)
BUN: 14 mg/dL (ref 6–20)
CO2: 25 mmol/L (ref 22–32)
Calcium: 9.1 mg/dL (ref 8.9–10.3)
Chloride: 107 mmol/L (ref 98–111)
Creatinine, Ser: 1.09 mg/dL (ref 0.61–1.24)
GFR calc Af Amer: >60 mL/min (ref >60)
GFR calc non Af Amer: >60 mL/min (ref >60)
Glucose, Bld: 130 mg/dL — ABNORMAL HIGH (ref 70–99)
Potassium: 3.8 mmol/L (ref 3.5–5.1)
Sodium: 140 mmol/L (ref 135–145)

## 2019-08-28 LAB — CBC
HCT: 43.6 % (ref 39.0–52.0)
Hemoglobin: 14.5 g/dL (ref 13.0–17.0)
MCH: 31.3 pg (ref 26.0–34.0)
MCHC: 33.3 g/dL (ref 30.0–36.0)
MCV: 94.2 fL (ref 80.0–100.0)
Platelets: 226 10*3/uL (ref 150–400)
RBC: 4.63 MIL/uL (ref 4.22–5.81)
RDW: 11.2 % — ABNORMAL LOW (ref 11.5–15.5)
WBC: 5.5 10*3/uL (ref 4.0–10.5)
nRBC: 0 % (ref 0.0–0.2)

## 2019-08-28 LAB — TROPONIN I (HIGH SENSITIVITY): Troponin I (High Sensitivity): 7 ng/L (ref <18)

## 2019-08-28 MED ORDER — SODIUM CHLORIDE 0.9% FLUSH: 3.0000 mL | Freq: Once | INTRAVENOUS | Status: DC

## 2019-08-28 NOTE — ED Triage Notes (Signed)
Patient reports positive Covid test this week with intermittent right side chest pain , nausea and diarrhea , denies SOB , no fever or chills .

## 2019-08-28 NOTE — ED Notes (Signed)
Pt ambulatory to sort desk stating that he is feeling a little bit better and that he would just like to return if condition worsens

## 2019-08-29 ENCOUNTER — Emergency Department (HOSPITAL_COMMUNITY)
Admission: EM | Admit: 2019-08-29 | Discharge: 2019-08-29 | Disposition: A | Discharge status: Home / Self Care | Payer: 59 | Attending: Emergency Medicine | Admitting: Emergency Medicine

## 2019-08-29 ENCOUNTER — Encounter (HOSPITAL_COMMUNITY): Payer: Self-pay | Admitting: Emergency Medicine

## 2019-08-29 ENCOUNTER — Other Ambulatory Visit: Payer: Self-pay

## 2019-08-29 DIAGNOSIS — R0602 Shortness of breath: Secondary | ICD-10-CM | POA: Diagnosis present

## 2019-08-29 DIAGNOSIS — R197 Diarrhea, unspecified: Secondary | ICD-10-CM | POA: Insufficient documentation

## 2019-08-29 DIAGNOSIS — U071 COVID-19: Secondary | ICD-10-CM | POA: Diagnosis not present

## 2019-08-29 DIAGNOSIS — R110 Nausea: Secondary | ICD-10-CM | POA: Diagnosis not present

## 2019-08-29 MED ORDER — ONDANSETRON 4 MG PO TBDP
4.0000 mg | ORAL_TABLET | Freq: Once | ORAL | Status: AC
Start: 2019-08-29 — End: 2019-08-29
  Administered 2019-08-29: 4 mg via ORAL
  Filled 2019-08-29: qty 1

## 2019-08-29 MED ORDER — ACETAMINOPHEN 325 MG PO TABS
650.0000 mg | ORAL_TABLET | Freq: Once | ORAL | Status: AC
  Administered 2019-08-29: 09:00:00 650 mg via ORAL
  Filled 2019-08-29: qty 2

## 2019-08-29 MED ORDER — ONDANSETRON 4 MG PO TBDP: ORAL_TABLET | ORAL | 0 refills | Status: AC

## 2019-08-29 NOTE — Discharge Instructions (Signed)
You have symptoms of COVID 19. You need to stay home at least 10 days from the time of diagnosis and need to be symptom free for 48 hrs before you can go back to normal life   See quarantine guidelines below   Take zofran for nausea. Take tylenol for fevers and chills   Expect Shortness of breath and cough and fever for several days   See your doctor  Pick up pulse Ox from pharmacy. If your oxygen is less than 90% consistently then return to the ED   Return to ER if you have worse shortness of breath, fever for a week, dehydration/      Person Under Monitoring Name: Samuel Cruz  Location: [address] [place] [address]   Infection Prevention Recommendations for Individuals Confirmed to have, or Being Evaluated for, 2019 Novel Coronavirus (COVID-19) Infection Who Receive Care at Home  Individuals who are confirmed to have, or are being evaluated for, COVID-19 should follow the prevention steps below until a healthcare provider or local or state health department says they can return to normal activities.  Stay home except to get medical care You should restrict activities outside your home, except for getting medical care. Do not go to work, school, or public areas, and do not use public transportation or taxis.  Call ahead before visiting your doctor Before your medical appointment, call the healthcare provider and tell them that you have, or are being evaluated for, COVID-19 infection. This will help the healthcare provider's office take steps to keep other people from getting infected. Ask your healthcare provider to call the local or state health department.  Monitor your symptoms Seek prompt medical attention if your illness is worsening (e.g., difficulty breathing). Before going to your medical appointment, call the healthcare provider and tell them that you have, or are being evaluated for, COVID-19 infection. Ask your healthcare provider to call the local  or state health department.  Wear a facemask You should wear a facemask that covers your nose and mouth when you are in the same room with other people and when you visit a healthcare provider. People who live with or visit you should also wear a facemask while they are in the same room with you.  Separate yourself from other people in your home As much as possible, you should stay in a different room from other people in your home. Also, you should use a separate bathroom, if available.  Avoid sharing household items You should not share dishes, drinking glasses, cups, eating utensils, towels, bedding, or other items with other people in your home. After using these items, you should wash them thoroughly with soap and water.  Cover your coughs and sneezes Cover your mouth and nose with a tissue when you cough or sneeze, or you can cough or sneeze into your sleeve. Throw used tissues in a lined trash can, and immediately wash your hands with soap and water for at least 20 seconds or use an alcohol-based hand rub.  Wash your [employer]hands Wash your hands often and thoroughly with soap and water for at least 20 seconds. You can use an alcohol-based hand sanitizer if soap and water are not available and if your hands are not visibly dirty. Avoid touching your eyes, nose, and mouth with unwashed hands.   Prevention Steps for Caregivers and Household Members of Individuals Confirmed to have, or Being Evaluated for, COVID-19 Infection Being Cared for in the Home  If you live with,  or provide care at home for, a person confirmed to have, or being evaluated for, COVID-19 infection please follow these guidelines to prevent infection:  Follow healthcare provider's instructions Make sure that you understand and can help the patient follow any healthcare provider instructions for all care.  Provide for the patient's basic needs You should help the patient with basic needs in the home and provide  support for getting groceries, prescriptions, and other personal needs.  Monitor the patient's symptoms If they are getting sicker, call his or her medical provider and tell them that the patient has, or is being evaluated for, COVID-19 infection. This will help the healthcare provider's office take steps to keep other people from getting infected. Ask the healthcare provider to call the local or state health department.  Limit the number of people who have contact with the patient If possible, have only one caregiver for the patient. Other household members should stay in another home or place of residence. If this is not possible, they should stay in another room, or be separated from the patient as much as possible. Use a separate bathroom, if available. Restrict visitors who do not have an essential need to be in the home.  Keep older adults, very young children, and other sick people away from the patient Keep older adults, very young children, and those who have compromised immune systems or chronic health conditions away from the patient. This includes people with chronic heart, lung, or kidney conditions, diabetes, and cancer.  Ensure good ventilation Make sure that shared spaces in the home have good air flow, such as from an air conditioner or an opened window, weather permitting.  Wash your hands often Wash your hands often and thoroughly with soap and water for at least 20 seconds. You can use an alcohol based hand sanitizer if soap and water are not available and if your hands are not visibly dirty. Avoid touching your eyes, nose, and mouth with unwashed hands. Use disposable paper towels to dry your hands. If not available, use dedicated cloth towels and replace them when they become wet.  Wear a facemask and gloves Wear a disposable facemask at all times in the room and gloves when you touch or have contact with the patient's blood, body fluids, and/or secretions or  excretions, such as sweat, saliva, sputum, nasal mucus, vomit, urine, or feces.  Ensure the mask fits over your nose and mouth tightly, and do not touch it during use. Throw out disposable facemasks and gloves after using them. Do not reuse. Wash your hands immediately after removing your facemask and gloves. If your personal clothing becomes contaminated, carefully remove clothing and launder. Wash your hands after handling contaminated clothing. Place all used disposable facemasks, gloves, and other waste in a lined container before disposing them with other household waste. Remove gloves and wash your hands immediately after handling these items.  Do not share dishes, glasses, or other household items with the patient Avoid sharing household items. You should not share dishes, drinking glasses, cups, eating utensils, towels, bedding, or other items with a patient who is confirmed to have, or being evaluated for, COVID-19 infection. After the person uses these items, you should wash them thoroughly with soap and water.  Wash laundry thoroughly Immediately remove and wash clothes or bedding that have blood, body fluids, and/or secretions or excretions, such as sweat, saliva, sputum, nasal mucus, vomit, urine, or feces, on them. Wear gloves when handling laundry from the patient. Read and  follow directions on labels of laundry or clothing items and detergent. In general, wash and dry with the warmest temperatures recommended on the label.  Clean all areas the individual has used often Clean all touchable surfaces, such as counters, tabletops, doorknobs, bathroom fixtures, toilets, phones, keyboards, tablets, and bedside tables, every day. Also, clean any surfaces that may have blood, body fluids, and/or secretions or excretions on them. Wear gloves when cleaning surfaces the patient has come in contact with. Use a diluted bleach solution (e.g., dilute bleach with 1 part bleach and 10 parts water)  or a household disinfectant with a label that says EPA-registered for coronaviruses. To make a bleach solution at home, add 1 tablespoon of bleach to 1 quart (4 cups) of water. For a larger supply, add  cup of bleach to 1 gallon (16 cups) of water. Read labels of cleaning products and follow recommendations provided on product labels. Labels contain instructions for safe and effective use of the cleaning product including precautions you should take when applying the product, such as wearing gloves or eye protection and making sure you have good ventilation during use of the product. Remove gloves and wash hands immediately after cleaning.  Monitor yourself for signs and symptoms of illness Caregivers and household members are considered close contacts, should monitor their health, and will be asked to limit movement outside of the home to the extent possible. Follow the monitoring steps for close contacts listed on the symptom monitoring form.   ? If you have additional questions, contact your local health department or call the epidemiologist on call at [phone] (available 24/7). ? This guidance is subject to change. For the most up-to-date guidance from [facility], please refer to their website: [email or web address]

## 2019-08-29 NOTE — ED Notes (Signed)
ED Provider at bedside. 

## 2019-08-29 NOTE — ED Provider Notes (Signed)
[facility] EMERGENCY DEPARTMENT Provider Note   CSN: [id number] Arrival date & time: 08/29/19  0358     History Chief Complaint  Patient presents with  . Covid+ / SOB    Samuel Cruz is a 43 y.o. male hx of asthma, here with shortness of breath, nausea, diarrhea.  Patient with some sinus congestion and nonproductive cough about a week ago He went to get tested for Covid on 12/23 and result came back positive on 12/25. He has some having increased cough and subjective shortness of breath. Also some nausea and loose stools as well.  Patient came in yesterday and had a normal chest x-ray and unremarkable labs .  Patient left without being seen due to long wait time.  Came back this morning to find out results and get a checkup.  Denies any COPD and patient does not smoke.  The history is provided by the patient.       Past Medical History:  Diagnosis Date  . Asthma    No problems now  . COVID-19     Patient Active Problem List   Diagnosis Date Noted  . Viral illness 10/08/2017  . Sinus congestion 10/08/2017  . Acute upper respiratory infection 06/22/2017  . Sinus pressure 06/22/2017  . Sore throat 06/22/2017    Past Surgical History:  Procedure Laterality Date  . INGUINAL HERNIA REPAIR Bilateral 03/01/2015   Procedure: LAPAROSCOPIC BILATERAL INGUINAL HERNIA REPAIRS WITH MESH;  Surgeon: Jackolyn Confer, MD;  Location: [facility];  Service: General;  Laterality: Bilateral;  . INSERTION OF MESH Bilateral 03/01/2015   Procedure: INSERTION OF MESH;  Surgeon: Jackolyn Confer, MD;  Location: [facility];  Service: General;  Laterality: Bilateral;       Family History  Adopted: Yes    Social History   Tobacco Use  . Smoking status: Never Smoker  . Smokeless tobacco: Never Used  Substance Use Topics  . Alcohol use: Yes    Comment: Rare  . Drug use: No    Home Medications Prior to Admission medications   Medication Sig Start Date End Date Taking?  Authorizing Provider  azithromycin (ZITHROMAX) 250 MG tablet Sig as indicated 10/08/17   Horald Pollen, MD  ibuprofen (ADVIL,MOTRIN) 200 MG tablet Take 400 mg by mouth every 6 (six) hours as needed for moderate pain.    [provider]    Allergies    Patient has no known allergies.  Review of Systems   Review of Systems  Respiratory: Positive for cough and shortness of breath.   Gastrointestinal: Positive for nausea.  All other systems reviewed and are negative.   Physical Exam Updated Vital Signs BP (!) 141/94 (BP Location: Right Arm)   Pulse 72   Temp 98 F (36.7 C) (Oral)   Resp 17   SpO2 100%   Physical Exam Vitals and nursing note reviewed.  Constitutional:      Comments: Not in distress, breathing comfortably   HENT:     Head: Normocephalic.     Nose: Nose normal.     Mouth/Throat:     Mouth: Mucous membranes are moist.  Eyes:     Extraocular Movements: Extraocular movements intact.     Pupils: Pupils are equal, round, and reactive to light.  Cardiovascular:     Rate and Rhythm: Normal rate and regular rhythm.     Pulses: Normal pulses.     Heart sounds: Normal heart sounds.  Pulmonary:     Effort: Pulmonary  effort is normal.     Breath sounds: Normal breath sounds.  Abdominal:     General: Abdomen is flat.     Palpations: Abdomen is soft.  Musculoskeletal:        General: Normal range of motion.     Cervical back: Normal range of motion.  Skin:    General: Skin is warm.     Capillary Refill: Capillary refill takes less than 2 seconds.  Neurological:     General: No focal deficit present.     Mental Status: He is alert and oriented to person, place, and time.  Psychiatric:        Mood and Affect: Mood normal.        Behavior: Behavior normal.     ED Results / Procedures / Treatments   Labs (all labs ordered are listed, but only abnormal results are displayed) Labs Reviewed - No data to display  EKG None  Radiology DG Chest  Portable 1 View  Result Date: 08/28/2019 CLINICAL DATA:  Chest pain EXAM: PORTABLE CHEST 1 VIEW COMPARISON:  July 15, 2016 FINDINGS: There is no edema or consolidation. Heart size and pulmonary vascularity are normal. No adenopathy. No pneumothorax. No bone lesions. IMPRESSION: No edema or consolidation. Electronically Signed   By: Bretta Bang III M.D.   On: 08/28/2019 20:39    Procedures Procedures (including critical care time)  Medications Ordered in ED Medications  ondansetron (ZOFRAN-ODT) disintegrating tablet 4 mg (has no administration in time range)  acetaminophen (TYLENOL) tablet 650 mg (has no administration in time range)    ED Course  I have reviewed the triage vital signs and the nursing notes.  Pertinent labs & imaging results that were available during my care of the patient were reviewed by me and considered in my medical decision making (see chart for details).    MDM Rules/Calculators/A&P                      Samuel Cruz is a 43 y.o. male here with cough, SOB, nausea. Tested positive for COVID on 12/23. Patient has minimal symptoms and appears well. His O2 is 100% on RA. I reviewed labs and CXR from yesterday and they were unremarkable. Doesn't meet criteria for admission. Told him to take tylenol for chills and fever, zofran for nausea and stay hydrated. Told him to pick up pulse Ox and if Pulse Ox is less than 90% consistently, then return to the ED. Gave other return precautions as well   Samuel Cruz was evaluated in Emergency Department on 08/29/2019 for the symptoms described in the history of present illness. He was evaluated in the context of the global COVID-19 pandemic, which necessitated consideration that the patient might be at risk for infection with the SARS-CoV-2 virus that causes COVID-19. Institutional protocols and algorithms that pertain to the evaluation of patients at risk for COVID-19 are in a state of rapid change based on information  released by regulatory bodies including the CDC and federal and state organizations. These policies and algorithms were followed during the patient's care in the ED.   Final Clinical Impression(s) / ED Diagnoses Final diagnoses:  None    Rx / DC Orders ED Discharge Orders    None       Charlynne Pander, MD 08/29/19 [id number]

## 2019-08-29 NOTE — ED Triage Notes (Addendum)
Patient reports SOB with chest tightness , nausea and diarrhea this evening , tested Covid positive last week . No fever or chills . Patient LWBS this evening but returned to be seen , chest x-ray and blood tests done .

## 2019-08-29 NOTE — ED Notes (Signed)
Patient verbalizes understanding of discharge instructions. Opportunity for questioning and answers were provided. Armband removed by staff, pt discharged from ED.  

## 2019-09-05 ENCOUNTER — Telehealth: Payer: Self-pay | Admitting: Registered Nurse

## 2019-09-05 ENCOUNTER — Ambulatory Visit: Payer: 59 | Admitting: Emergency Medicine

## 2019-09-05 ENCOUNTER — Other Ambulatory Visit: Payer: Self-pay

## 2019-09-06 ENCOUNTER — Telehealth: Payer: Self-pay

## 2019-09-06 ENCOUNTER — Telehealth: Payer: 59 | Admitting: Emergency Medicine

## 2019-09-06 ENCOUNTER — Other Ambulatory Visit: Payer: Self-pay

## 2019-09-06 NOTE — Telephone Encounter (Signed)
Called pt @ 5:14PM no answer. Left a voice mail.

## 2019-09-06 NOTE — Telephone Encounter (Signed)
Called pt's moble number for a second time at 4:20 no answer. I also called pt's home phone number on file and left a VM to call us back.

## 2019-09-06 NOTE — Telephone Encounter (Signed)
Called pt at 2:00 PM. No answer will try again.

## 2019-09-11 ENCOUNTER — Encounter: Payer: Self-pay | Admitting: Emergency Medicine

## 2019-10-02 ENCOUNTER — Telehealth (INDEPENDENT_AMBULATORY_CARE_PROVIDER_SITE_OTHER): Payer: No Typology Code available for payment source | Admitting: Emergency Medicine

## 2019-10-02 ENCOUNTER — Encounter: Payer: Self-pay | Admitting: Emergency Medicine

## 2019-10-02 ENCOUNTER — Other Ambulatory Visit: Payer: Self-pay

## 2019-10-02 VITALS — BP 137/70 | Ht 71.0 in | Wt 170.0 lb

## 2019-10-02 DIAGNOSIS — U071 COVID-19: Secondary | ICD-10-CM

## 2019-10-02 NOTE — Progress Notes (Signed)
Telemedicine Encounter- SOAP NOTE Established Patient  This telephone encounter was conducted with the patient's (or proxy's) verbal consent via audio telecommunications: yes/no: Yes Patient was instructed to have this encounter in a suitably private space; and to only have persons present to whom they give permission to participate. In addition, patient identity was confirmed by use of name plus two identifiers (DOB and address).  I discussed the limitations, risks, security and privacy concerns of performing an evaluation and management service by telephone and the availability of in person appointments. I also discussed with the patient that there may be a patient responsible charge related to this service. The patient expressed understanding and agreed to proceed.  I spent a total of TIME; 0 MIN TO 60 MIN: 15 minutes talking with the patient or their proxy.  Chief Complaint  Patient presents with  . muscle soreness    chest wall and back muscles, patient was diagnosed 08/23/2019 with COVID - per pt he has been exercising since [place] but feel nausea with other Sxs     Subjective   Gordie Crumby Mcmichael is a 44 y.o. male established patient. Telephone visit today status post Covid infection 08/23/2019.  Still has some endurance problems during physical activity.  Works out every day but unable to go 100%.  Feels like he is 80% recovered.  However overall has been slowly improving.  No other complaints or medical concerns today.  HPI   There are no problems to display for this patient.   Past Medical History:  Diagnosis Date  . Asthma    No problems now  . COVID-19     Current Outpatient Medications  Medication Sig Dispense Refill  . ibuprofen (ADVIL,MOTRIN) 200 MG tablet Take 400 mg by mouth every 6 (six) hours as needed for moderate pain.    Marland Kitchen ondansetron (ZOFRAN ODT) 4 MG disintegrating tablet 4mg  ODT q4 hours prn nausea/vomit (Patient not taking: Reported on 10/02/2019) 10 tablet 0    No current facility-administered medications for this visit.    No Known Allergies  Social History   Socioeconomic History  . Marital status: Married    Spouse name: Not on file  . Number of children: Not on file  . Years of education: Not on file  . Highest education level: Not on file  Occupational History  . Not on file  Tobacco Use  . Smoking status: Never Smoker  . Smokeless tobacco: Never Used  Substance and Sexual Activity  . Alcohol use: Yes    Comment: Rare  . Drug use: No  . Sexual activity: Not on file  Other Topics Concern  . Not on file  Social History Narrative  . Not on file   Social Determinants of Health   Financial Resource Strain:   . Difficulty of Paying Living Expenses: Not on file  Food Insecurity:   . Worried About Charity fundraiser in the Last Year: Not on file  . Ran Out of Food in the Last Year: Not on file  Transportation Needs:   . Lack of Transportation (Medical): Not on file  . Lack of Transportation (Non-Medical): Not on file  Physical Activity:   . Days of Exercise per Week: Not on file  . Minutes of Exercise per Session: Not on file  Stress:   . Feeling of Stress : Not on file  Social Connections:   . Frequency of Communication with Friends and Family: Not on file  . Frequency of Social Gatherings  with Friends and Family: Not on file  . Attends Religious Services: Not on file  . Active Member of Clubs or Organizations: Not on file  . Attends Banker Meetings: Not on file  . Marital Status: Not on file  Intimate Partner Violence:   . Fear of Current or Ex-Partner: Not on file  . Emotionally Abused: Not on file  . Physically Abused: Not on file  . Sexually Abused: Not on file    Review of Systems  Constitutional: Negative.  Negative for chills and fever.  HENT: Negative.  Negative for sinus pain and sore throat.   Respiratory: Negative.  Negative for cough and shortness of breath.   Cardiovascular:  Negative.  Negative for chest pain and palpitations.  Gastrointestinal: Positive for nausea. Negative for blood in stool, diarrhea, heartburn and vomiting.  Genitourinary: Negative.  Negative for dysuria and hematuria.  Musculoskeletal: Negative.  Negative for back pain, myalgias and neck pain.  Skin: Negative.  Negative for rash.  Neurological: Positive for weakness. Negative for dizziness, tingling and headaches.  All other systems reviewed and are negative.   Objective  Alert and oriented x3 in no apparent respiratory distress. Vitals as reported by the patient: Today's Vitals   10/02/19 1700  BP: 137/70  SpO2: 98%  Weight: 170 lb (77.1 kg)  Height: 5\' 11"  (1.803 m)    There are no diagnoses linked to this encounter.  Devonte was seen today for muscle soreness.  Diagnoses and all orders for this visit:  COVID-19 virus infection Comments: Stable and recovering well     Clinically stable and recovering well from Covid infection.  No red flag signs or symptoms. Will schedule office visit in the next 2 to 3 weeks for further evaluation.  I discussed the assessment and treatment plan with the patient. The patient was provided an opportunity to ask questions and all were answered. The patient agreed with the plan and demonstrated an understanding of the instructions.   The patient was advised to call back or seek an in-person evaluation if the symptoms worsen or if the condition fails to improve as anticipated.  I provided 15 minutes of non-face-to-face time during this encounter.  , MD  Primary Care at [facility]

## 2019-10-03 ENCOUNTER — Telehealth: Payer: Self-pay | Admitting: Emergency Medicine

## 2019-10-03 NOTE — Progress Notes (Signed)
Lvmtcb and schedule

## 2021-02-27 ENCOUNTER — Other Ambulatory Visit (HOSPITAL_COMMUNITY): Payer: Self-pay

## 2021-02-27 MED ORDER — CARESTART COVID-19 HOME TEST VI KIT
PACK | 0 refills | Status: AC
  Filled 2021-02-27: qty 4, 4d supply, fill #0

## 2021-03-11 ENCOUNTER — Other Ambulatory Visit (HOSPITAL_COMMUNITY): Payer: Self-pay

## 2021-03-21 ENCOUNTER — Other Ambulatory Visit (HOSPITAL_COMMUNITY): Payer: Self-pay

## 2021-04-02 ENCOUNTER — Other Ambulatory Visit (HOSPITAL_COMMUNITY): Payer: Self-pay

## 2021-05-13 ENCOUNTER — Other Ambulatory Visit (HOSPITAL_COMMUNITY): Payer: Self-pay

## 2021-10-24 ENCOUNTER — Other Ambulatory Visit: Payer: Self-pay

## 2021-10-24 ENCOUNTER — Inpatient Hospital Stay (HOSPITAL_COMMUNITY): Payer: 59

## 2021-10-24 ENCOUNTER — Emergency Department (HOSPITAL_COMMUNITY): Payer: 59

## 2021-10-24 ENCOUNTER — Encounter (HOSPITAL_COMMUNITY): Payer: Self-pay

## 2021-10-24 ENCOUNTER — Inpatient Hospital Stay (HOSPITAL_COMMUNITY)
Admission: EM | Admit: 2021-10-24 | Discharge: 2021-10-29 | DRG: 200 | Disposition: A | Discharge status: Home / Self Care | Payer: 59 | Attending: Surgery | Admitting: Surgery

## 2021-10-24 DIAGNOSIS — D62 Acute posthemorrhagic anemia: Secondary | ICD-10-CM | POA: Diagnosis not present

## 2021-10-24 DIAGNOSIS — J939 Pneumothorax, unspecified: Secondary | ICD-10-CM | POA: Diagnosis not present

## 2021-10-24 DIAGNOSIS — S21111A Laceration without foreign body of right front wall of thorax without penetration into thoracic cavity, initial encounter: Secondary | ICD-10-CM | POA: Diagnosis not present

## 2021-10-24 DIAGNOSIS — W269XXA Contact with unspecified sharp object(s), initial encounter: Secondary | ICD-10-CM

## 2021-10-24 DIAGNOSIS — Z23 Encounter for immunization: Secondary | ICD-10-CM | POA: Diagnosis not present

## 2021-10-24 DIAGNOSIS — S272XXA Traumatic hemopneumothorax, initial encounter: Secondary | ICD-10-CM | POA: Diagnosis not present

## 2021-10-24 DIAGNOSIS — S21311A Laceration without foreign body of right front wall of thorax with penetration into thoracic cavity, initial encounter: Secondary | ICD-10-CM | POA: Diagnosis not present

## 2021-10-24 DIAGNOSIS — S270XXA Traumatic pneumothorax, initial encounter: Secondary | ICD-10-CM

## 2021-10-24 DIAGNOSIS — S41101A Unspecified open wound of right upper arm, initial encounter: Secondary | ICD-10-CM | POA: Diagnosis not present

## 2021-10-24 DIAGNOSIS — R55 Syncope and collapse: Secondary | ICD-10-CM | POA: Diagnosis not present

## 2021-10-24 DIAGNOSIS — I10 Essential (primary) hypertension: Secondary | ICD-10-CM | POA: Diagnosis not present

## 2021-10-24 DIAGNOSIS — S21331A Puncture wound without foreign body of right front wall of thorax with penetration into thoracic cavity, initial encounter: Secondary | ICD-10-CM | POA: Diagnosis not present

## 2021-10-24 DIAGNOSIS — S31639A Puncture wound without foreign body of abdominal wall, unspecified quadrant with penetration into peritoneal cavity, initial encounter: Secondary | ICD-10-CM | POA: Diagnosis not present

## 2021-10-24 DIAGNOSIS — R0789 Other chest pain: Secondary | ICD-10-CM | POA: Diagnosis not present

## 2021-10-24 DIAGNOSIS — I959 Hypotension, unspecified: Secondary | ICD-10-CM | POA: Diagnosis present

## 2021-10-24 DIAGNOSIS — R110 Nausea: Secondary | ICD-10-CM | POA: Diagnosis not present

## 2021-10-24 DIAGNOSIS — S41112A Laceration without foreign body of left upper arm, initial encounter: Secondary | ICD-10-CM | POA: Diagnosis not present

## 2021-10-24 DIAGNOSIS — Z20822 Contact with and (suspected) exposure to covid-19: Secondary | ICD-10-CM | POA: Diagnosis present

## 2021-10-24 DIAGNOSIS — J9811 Atelectasis: Secondary | ICD-10-CM | POA: Diagnosis not present

## 2021-10-24 DIAGNOSIS — S27321A Contusion of lung, unilateral, initial encounter: Secondary | ICD-10-CM | POA: Diagnosis present

## 2021-10-24 DIAGNOSIS — Z4682 Encounter for fitting and adjustment of non-vascular catheter: Secondary | ICD-10-CM | POA: Diagnosis not present

## 2021-10-24 DIAGNOSIS — R61 Generalized hyperhidrosis: Secondary | ICD-10-CM | POA: Diagnosis not present

## 2021-10-24 DIAGNOSIS — J942 Hemothorax: Secondary | ICD-10-CM | POA: Diagnosis present

## 2021-10-24 DIAGNOSIS — J439 Emphysema, unspecified: Secondary | ICD-10-CM | POA: Diagnosis not present

## 2021-10-24 DIAGNOSIS — R0902 Hypoxemia: Secondary | ICD-10-CM | POA: Diagnosis not present

## 2021-10-24 DIAGNOSIS — R918 Other nonspecific abnormal finding of lung field: Secondary | ICD-10-CM | POA: Diagnosis not present

## 2021-10-24 DIAGNOSIS — T1490XA Injury, unspecified, initial encounter: Secondary | ICD-10-CM

## 2021-10-24 DIAGNOSIS — Z9689 Presence of other specified functional implants: Secondary | ICD-10-CM

## 2021-10-24 DIAGNOSIS — S199XXA Unspecified injury of neck, initial encounter: Secondary | ICD-10-CM | POA: Diagnosis not present

## 2021-10-24 DIAGNOSIS — T797XXA Traumatic subcutaneous emphysema, initial encounter: Secondary | ICD-10-CM | POA: Diagnosis not present

## 2021-10-24 DIAGNOSIS — S41132A Puncture wound without foreign body of left upper arm, initial encounter: Secondary | ICD-10-CM | POA: Diagnosis not present

## 2021-10-24 DIAGNOSIS — S0101XA Laceration without foreign body of scalp, initial encounter: Secondary | ICD-10-CM | POA: Diagnosis not present

## 2021-10-24 HISTORY — DX: Other specified health status: Z78.9

## 2021-10-24 LAB — I-STAT CHEM 8, ED
BUN: 17 mg/dL (ref 6–20)
Calcium, Ion: 1.09 mmol/L — ABNORMAL LOW (ref 1.15–1.40)
Chloride: 106 mmol/L (ref 98–111)
Creatinine, Ser: 1.5 mg/dL — ABNORMAL HIGH (ref 0.61–1.24)
Glucose, Bld: 111 mg/dL — ABNORMAL HIGH (ref 70–99)
HCT: 40 % (ref 39.0–52.0)
Hemoglobin: 13.6 g/dL (ref 13.0–17.0)
Potassium: 3.5 mmol/L (ref 3.5–5.1)
Sodium: 139 mmol/L (ref 135–145)
TCO2: 21 mmol/L — ABNORMAL LOW (ref 22–32)

## 2021-10-24 LAB — CBC
HCT: 42 % (ref 39.0–52.0)
Hemoglobin: 13.7 g/dL (ref 13.0–17.0)
MCH: 31.4 pg (ref 26.0–34.0)
MCHC: 32.6 g/dL (ref 30.0–36.0)
MCV: 96.1 fL (ref 80.0–100.0)
Platelets: 231 10*3/uL (ref 150–400)
RBC: 4.37 MIL/uL (ref 4.22–5.81)
RDW: 11.5 % (ref 11.5–15.5)
WBC: 6.9 10*3/uL (ref 4.0–10.5)
nRBC: 0 % (ref 0.0–0.2)

## 2021-10-24 LAB — RESP PANEL BY RT-PCR (FLU A&B, COVID) ARPGX2
Influenza A by PCR: NEGATIVE
Influenza B by PCR: NEGATIVE
SARS Coronavirus 2 by RT PCR: NEGATIVE

## 2021-10-24 LAB — ABO/RH: ABO/RH(D): A POS

## 2021-10-24 LAB — COMPREHENSIVE METABOLIC PANEL
ALT: 23 U/L (ref 0–44)
AST: 25 U/L (ref 15–41)
Albumin: 3.6 g/dL (ref 3.5–5.0)
Alkaline Phosphatase: 51 U/L (ref 38–126)
Anion gap: 12 (ref 5–15)
BUN: 15 mg/dL (ref 6–20)
CO2: 20 mmol/L — ABNORMAL LOW (ref 22–32)
Calcium: 9 mg/dL (ref 8.9–10.3)
Chloride: 106 mmol/L (ref 98–111)
Creatinine, Ser: 1.44 mg/dL — ABNORMAL HIGH (ref 0.61–1.24)
GFR, Estimated: >60 mL/min (ref >60)
Glucose, Bld: 113 mg/dL — ABNORMAL HIGH (ref 70–99)
Potassium: 3.6 mmol/L (ref 3.5–5.1)
Sodium: 138 mmol/L (ref 135–145)
Total Bilirubin: 0.8 mg/dL (ref 0.3–1.2)
Total Protein: 6.1 g/dL — ABNORMAL LOW (ref 6.5–8.1)

## 2021-10-24 LAB — HIV ANTIBODY (ROUTINE TESTING W REFLEX): HIV Screen 4th Generation wRfx: NONREACTIVE

## 2021-10-24 LAB — PROTIME-INR
INR: 0.9 (ref 0.8–1.2)
Prothrombin Time: 12.4 seconds (ref 11.4–15.2)

## 2021-10-24 LAB — TYPE AND SCREEN
ABO/RH(D): A POS
Antibody Screen: NEGATIVE

## 2021-10-24 LAB — ETHANOL: Alcohol, Ethyl (B): <10 mg/dL (ref <10)

## 2021-10-24 MED ORDER — TETANUS-DIPHTH-ACELL PERTUSSIS 5-2.5-18.5 LF-MCG/0.5 IM SUSY
0.5000 mL | PREFILLED_SYRINGE | Freq: Once | INTRAMUSCULAR | Status: AC
  Administered 2021-10-24: 0.5 mL via INTRAMUSCULAR

## 2021-10-24 MED ORDER — KETOROLAC TROMETHAMINE 15 MG/ML IJ SOLN
30.0000 mg | Freq: Four times a day (QID) | INTRAMUSCULAR | Status: DC
  Administered 2021-10-24 – 2021-10-29 (×18): 30 mg via INTRAVENOUS
  Filled 2021-10-24 (×21): qty 2

## 2021-10-24 MED ORDER — ENOXAPARIN SODIUM 30 MG/0.3ML IJ SOSY
30.0000 mg | PREFILLED_SYRINGE | Freq: Two times a day (BID) | INTRAMUSCULAR | Status: DC
  Administered 2021-10-25 – 2021-10-29 (×9): 30 mg via SUBCUTANEOUS
  Filled 2021-10-24 (×10): qty 0.3

## 2021-10-24 MED ORDER — MORPHINE SULFATE (PF) 4 MG/ML IV SOLN: 4.0000 mg | INTRAVENOUS | Status: DC | PRN

## 2021-10-24 MED ORDER — ONDANSETRON HCL 4 MG/2ML IJ SOLN
INTRAMUSCULAR | Status: AC
  Administered 2021-10-24: 4 mg via INTRAVENOUS
  Filled 2021-10-24: qty 2

## 2021-10-24 MED ORDER — LIDOCAINE HCL (PF) 1 % IJ SOLN
15.0000 mL | Freq: Once | INTRAMUSCULAR | Status: AC
  Administered 2021-10-24: 15 mL via INTRADERMAL
  Filled 2021-10-24: qty 15

## 2021-10-24 MED ORDER — FENTANYL CITRATE (PF) 2500 MCG/50ML IJ SOLN: 50.0000 ug/h | Freq: Once | Status: DC

## 2021-10-24 MED ORDER — ACETAMINOPHEN 500 MG PO TABS
1000.0000 mg | ORAL_TABLET | Freq: Four times a day (QID) | ORAL | Status: DC
  Administered 2021-10-25 – 2021-10-29 (×17): 1000 mg via ORAL
  Filled 2021-10-24 (×18): qty 2

## 2021-10-24 MED ORDER — CEFAZOLIN SODIUM-DEXTROSE 2-4 GM/100ML-% IV SOLN
2.0000 g | Freq: Once | INTRAVENOUS | Status: AC
Start: 2021-10-24 — End: 2021-10-24
  Administered 2021-10-24: 2 g via INTRAVENOUS

## 2021-10-24 MED ORDER — IOHEXOL 350 MG/ML SOLN
100.0000 mL | Freq: Once | INTRAVENOUS | Status: AC | PRN
  Administered 2021-10-24: 100 mL via INTRAVENOUS

## 2021-10-24 MED ORDER — LIDOCAINE HCL (PF) 1 % IJ SOLN
10.0000 mL | Freq: Once | INTRAMUSCULAR | Status: AC
  Administered 2021-10-24: 10 mL via INTRADERMAL
  Filled 2021-10-24: qty 10

## 2021-10-24 MED ORDER — METHOCARBAMOL 500 MG PO TABS
1000.0000 mg | ORAL_TABLET | Freq: Three times a day (TID) | ORAL | Status: DC
Start: 2021-10-24 — End: 2021-10-29
  Administered 2021-10-24 – 2021-10-29 (×14): 1000 mg via ORAL
  Filled 2021-10-24 (×14): qty 2

## 2021-10-24 MED ORDER — OXYCODONE HCL 5 MG PO TABS
5.0000 mg | ORAL_TABLET | ORAL | Status: DC | PRN
  Administered 2021-10-24 – 2021-10-26 (×2): 10 mg via ORAL
  Filled 2021-10-24 (×3): qty 2

## 2021-10-24 MED ORDER — ONDANSETRON HCL 4 MG/2ML IJ SOLN
4.0000 mg | Freq: Once | INTRAMUSCULAR | Status: AC
Start: 2021-10-24 — End: 2021-10-24

## 2021-10-24 MED ORDER — DOCUSATE SODIUM 100 MG PO CAPS
100.0000 mg | ORAL_CAPSULE | Freq: Two times a day (BID) | ORAL | Status: DC
  Administered 2021-10-24 – 2021-10-29 (×10): 100 mg via ORAL
  Filled 2021-10-24 (×10): qty 1

## 2021-10-24 MED ORDER — SODIUM CHLORIDE 0.9 % IV BOLUS
1000.0000 mL | Freq: Once | INTRAVENOUS | Status: AC
  Administered 2021-10-24: 1000 mL via INTRAVENOUS

## 2021-10-24 MED ORDER — FENTANYL CITRATE (PF) 100 MCG/2ML IJ SOLN
INTRAMUSCULAR | Status: AC
  Administered 2021-10-24: 50 ug
  Filled 2021-10-24: qty 2

## 2021-10-24 MED ORDER — FENTANYL CITRATE PF 50 MCG/ML IJ SOSY
75.0000 ug | PREFILLED_SYRINGE | Freq: Once | INTRAMUSCULAR | Status: AC
  Administered 2021-10-24: 75 ug via INTRAVENOUS

## 2021-10-24 MED ORDER — ONDANSETRON HCL 4 MG/2ML IJ SOLN
4.0000 mg | Freq: Four times a day (QID) | INTRAMUSCULAR | Status: DC | PRN
  Administered 2021-10-25 – 2021-10-28 (×3): 4 mg via INTRAVENOUS
  Filled 2021-10-24 (×3): qty 2

## 2021-10-24 MED ORDER — ONDANSETRON 4 MG PO TBDP
4.0000 mg | ORAL_TABLET | Freq: Four times a day (QID) | ORAL | Status: DC | PRN
Start: 2021-10-24 — End: 2021-10-29

## 2021-10-24 MED ORDER — LACTATED RINGERS IV SOLN: INTRAVENOUS | Status: DC

## 2021-10-24 MED ORDER — FENTANYL CITRATE (PF) 100 MCG/2ML IJ SOLN
INTRAMUSCULAR | Status: AC
  Filled 2021-10-24: qty 2
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
## 2021-10-24 NOTE — ED Notes (Signed)
X-ray at bedside
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
## 2021-10-24 NOTE — ED Notes (Signed)
Dr Allen at bedside  
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
## 2021-10-24 NOTE — ED Notes (Signed)
Pt signed consent for chest tube insertion at bedside

## 2021-10-24 NOTE — ED Notes (Signed)
32 chest tube in place at 17
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
## 2021-10-24 NOTE — ED Notes (Signed)
Xray called to shoot repeat chest

## 2021-10-24 NOTE — ED Notes (Signed)
MD Zenia Resides at bedside pulling back chest tube

## 2021-10-24 NOTE — ED Notes (Signed)
MD Ledford at bedside suturing pt

## 2021-10-24 NOTE — ED Notes (Signed)
4 of zofran and 50 of fentanyl given

## 2021-10-24 NOTE — Procedures (Addendum)
Chest tube insertion  Date/Time: 10/24/2021 9:42 PM Performed by: Fritzi Mandes, MD Authorized by: Fritzi Mandes, MD   Consent:    Consent obtained:  Written   Consent given by:  Patient Anesthesia (see MAR for exact dosages):    Anesthesia method:  Local infiltration Procedure details:    Placement location:  R lateral   Scalpel size:  11   Tube size (Fr):  32   Technique: blunt     Dissection instrument:  Finger and Kelly clamp   Tension pneumothorax: no     Tube connected to:  Suction   Drainage characteristics:  Bloody   Suture material:  0 silk   Dressing:  Petrolatum-impregnated gauze and 4x4 sterile gauze Post-procedure details:    Post-insertion x-ray findings: tube repositioned     Patient tolerance of procedure:  Tolerated well, no immediate complications  I was called to assist with placement of chest tube by EDP. Procedure had been started and incision made on the right lateral chest wall at the anterior axillary line at about the level of the nipple. I palpated the chest wall, identified an intercostal space, and bluntly entered the thoracic cavity using a kelly clamp. There was immediate return of air and a finger sweep was used to confirm entry into the pleural space. The skin incision was further extended and a 32-Fr chest tube was inserted into the pleural space and secured to the skin at 18cm with a 0 silk suture, and placed to - suction. Post-procedure CXR showed persistent pneumothorax with the sentinel hole on the tube close to the hilum of the lung. The tube was retracted to 14cm at the skin and resecured with return of more air and suction was increased to - , and repeat CXR showed significant decrease in size of the pneumothorax.  Sophronia Simas, MD 10/24/21 9:48 PM
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
## 2021-10-25 ENCOUNTER — Encounter (HOSPITAL_COMMUNITY): Payer: Self-pay

## 2021-10-25 ENCOUNTER — Inpatient Hospital Stay (HOSPITAL_COMMUNITY): Payer: 59

## 2021-10-25 DIAGNOSIS — S21111A Laceration without foreign body of right front wall of thorax without penetration into thoracic cavity, initial encounter: Secondary | ICD-10-CM | POA: Diagnosis present

## 2021-10-25 DIAGNOSIS — S27321A Contusion of lung, unilateral, initial encounter: Secondary | ICD-10-CM | POA: Diagnosis present

## 2021-10-25 DIAGNOSIS — S41112A Laceration without foreign body of left upper arm, initial encounter: Secondary | ICD-10-CM | POA: Diagnosis present

## 2021-10-25 LAB — CBC
HCT: 32.6 % — ABNORMAL LOW (ref 39.0–52.0)
Hemoglobin: 11.1 g/dL — ABNORMAL LOW (ref 13.0–17.0)
MCH: 32.1 pg (ref 26.0–34.0)
MCHC: 34 g/dL (ref 30.0–36.0)
MCV: 94.2 fL (ref 80.0–100.0)
Platelets: 167 10*3/uL (ref 150–400)
RBC: 3.46 MIL/uL — ABNORMAL LOW (ref 4.22–5.81)
RDW: 11.6 % (ref 11.5–15.5)
WBC: 9.3 10*3/uL (ref 4.0–10.5)
nRBC: 0 % (ref 0.0–0.2)

## 2021-10-25 LAB — BASIC METABOLIC PANEL
Anion gap: 8 (ref 5–15)
BUN: 13 mg/dL (ref 6–20)
CO2: 22 mmol/L (ref 22–32)
Calcium: 8.2 mg/dL — ABNORMAL LOW (ref 8.9–10.3)
Chloride: 105 mmol/L (ref 98–111)
Creatinine, Ser: 1.25 mg/dL — ABNORMAL HIGH (ref 0.61–1.24)
GFR, Estimated: >60 mL/min (ref >60)
Glucose, Bld: 126 mg/dL — ABNORMAL HIGH (ref 70–99)
Potassium: 3.8 mmol/L (ref 3.5–5.1)
Sodium: 135 mmol/L (ref 135–145)

## 2021-10-25 NOTE — Progress Notes (Signed)
Pt informed RN he felt something was leaking at the chest tube site. Chest tube dsg change done with no active bleeding from the site except small ooze noted from around the site. Petrolatum-impregnated dsg at site maintained and  new 4x4 sterile gauze with abd dsg applied. Dsg clean, dry and intact and no new drainage noted. MD at bedside notified and he ordered to turn down the suction from -73mmHg to -27mmHg. Suction now at -47mmHg. Pt denies an distress or discomfort and RN will continue to monitor pt closely. Delia Heady RN

## 2021-10-25 NOTE — Discharge Instructions (Addendum)
For counseling supports please see the following   [facility] email: [email or web address] phone: [phone] fax: [phone]
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
## 2021-10-25 NOTE — Progress Notes (Signed)
Per pt, he takes no daily medications and has NKDA.
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
## 2021-10-26 ENCOUNTER — Inpatient Hospital Stay (HOSPITAL_COMMUNITY): Payer: 59

## 2021-10-26 LAB — BASIC METABOLIC PANEL
Anion gap: 5 (ref 5–15)
BUN: 12 mg/dL (ref 6–20)
CO2: 26 mmol/L (ref 22–32)
Calcium: 8.3 mg/dL — ABNORMAL LOW (ref 8.9–10.3)
Chloride: 108 mmol/L (ref 98–111)
Creatinine, Ser: 1.18 mg/dL (ref 0.61–1.24)
GFR, Estimated: >60 mL/min (ref >60)
Glucose, Bld: 93 mg/dL (ref 70–99)
Potassium: 4.5 mmol/L (ref 3.5–5.1)
Sodium: 139 mmol/L (ref 135–145)

## 2021-10-26 LAB — CBC
HCT: 30.8 % — ABNORMAL LOW (ref 39.0–52.0)
Hemoglobin: 10.5 g/dL — ABNORMAL LOW (ref 13.0–17.0)
MCH: 32.1 pg (ref 26.0–34.0)
MCHC: 34.1 g/dL (ref 30.0–36.0)
MCV: 94.2 fL (ref 80.0–100.0)
Platelets: 131 10*3/uL — ABNORMAL LOW (ref 150–400)
RBC: 3.27 MIL/uL — ABNORMAL LOW (ref 4.22–5.81)
RDW: 11.7 % (ref 11.5–15.5)
WBC: 5.1 10*3/uL (ref 4.0–10.5)
nRBC: 0 % (ref 0.0–0.2)
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
## 2021-10-26 NOTE — Progress Notes (Signed)
Patients heart rate down in the 40's when sleeping. Patient asymptomatic. States that is normal for him, he is a runner and not surprised about his heart rate going down. Once awaken, it immediately came back up to the 70's. Will continue to monitor at this time.

## 2021-10-26 NOTE — Progress Notes (Signed)
Transition of Care [facility]) - CAGE-AID Screening   Patient Details  Name: Samuel Cruz MRN: [id number] Date of Birth: 15-Jun-1976  Hewitt Shorts, RN Trauma Response Nurse Phone Number: [phone] 10/26/2021, 9:53 AM   CAGE-AID Screening:    Have You Ever Felt You Ought to Cut Down on Your Drinking or Drug Use?: No Have People Annoyed You By Critizing Your Drinking Or Drug Use?: No Have You Felt Bad Or Guilty About Your Drinking Or Drug Use?: No Have You Ever Had a Drink or Used Drugs First Thing In The Morning to Steady Your Nerves or to Get Rid of a Hangover?: No CAGE-AID Score: 0  Substance Abuse Education Offered: No (pt denies drinking alcohol or using any street drugs)

## 2021-10-27 ENCOUNTER — Inpatient Hospital Stay (HOSPITAL_COMMUNITY): Payer: 59

## 2021-10-27 LAB — BASIC METABOLIC PANEL
Anion gap: 3 — ABNORMAL LOW (ref 5–15)
BUN: 12 mg/dL (ref 6–20)
CO2: 29 mmol/L (ref 22–32)
Calcium: 8.6 mg/dL — ABNORMAL LOW (ref 8.9–10.3)
Chloride: 107 mmol/L (ref 98–111)
Creatinine, Ser: 1.23 mg/dL (ref 0.61–1.24)
GFR, Estimated: >60 mL/min (ref >60)
Glucose, Bld: 93 mg/dL (ref 70–99)
Potassium: 4.5 mmol/L (ref 3.5–5.1)
Sodium: 139 mmol/L (ref 135–145)

## 2021-10-27 LAB — CBC
HCT: 29.9 % — ABNORMAL LOW (ref 39.0–52.0)
Hemoglobin: 10.6 g/dL — ABNORMAL LOW (ref 13.0–17.0)
MCH: 32.5 pg (ref 26.0–34.0)
MCHC: 35.5 g/dL (ref 30.0–36.0)
MCV: 91.7 fL (ref 80.0–100.0)
Platelets: 145 10*3/uL — ABNORMAL LOW (ref 150–400)
RBC: 3.26 MIL/uL — ABNORMAL LOW (ref 4.22–5.81)
RDW: 11.5 % (ref 11.5–15.5)
WBC: 5.5 10*3/uL (ref 4.0–10.5)
nRBC: 0 % (ref 0.0–0.2)

## 2021-10-27 NOTE — Progress Notes (Signed)
Mobility Specialist: Progress Note   10/27/21 1037  Mobility  Activity Ambulated independently in hallway  Level of Assistance Independent  Assistive Device None  Distance Ambulated (ft) 600 ft  Activity Response Tolerated well  $Mobility charge 1 Mobility   Pt independent with mobility and managing chest tube. C/o some discomfort at chest tube site, otherwise asymptomatic. Pt back to bed after session with call bell and phone in reach.   [facility] Mobility Specialist Mobility Specialist 5 [address]: [phone] Mobility Specialist 6 North: [phone]
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
## 2021-10-28 LAB — CBC
HCT: 30.2 % — ABNORMAL LOW (ref 39.0–52.0)
Hemoglobin: 10.5 g/dL — ABNORMAL LOW (ref 13.0–17.0)
MCH: 32.4 pg (ref 26.0–34.0)
MCHC: 34.8 g/dL (ref 30.0–36.0)
MCV: 93.2 fL (ref 80.0–100.0)
Platelets: 146 10*3/uL — ABNORMAL LOW (ref 150–400)
RBC: 3.24 MIL/uL — ABNORMAL LOW (ref 4.22–5.81)
RDW: 11.5 % (ref 11.5–15.5)
WBC: 4.6 10*3/uL (ref 4.0–10.5)
nRBC: 0 % (ref 0.0–0.2)

## 2021-10-28 LAB — BASIC METABOLIC PANEL
Anion gap: 7 (ref 5–15)
BUN: 10 mg/dL (ref 6–20)
CO2: 25 mmol/L (ref 22–32)
Calcium: 8.3 mg/dL — ABNORMAL LOW (ref 8.9–10.3)
Chloride: 107 mmol/L (ref 98–111)
Creatinine, Ser: 1.05 mg/dL (ref 0.61–1.24)
GFR, Estimated: >60 mL/min (ref >60)
Glucose, Bld: 106 mg/dL — ABNORMAL HIGH (ref 70–99)
Potassium: 4 mmol/L (ref 3.5–5.1)
Sodium: 139 mmol/L (ref 135–145)
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
## 2021-10-28 NOTE — Progress Notes (Signed)
Mobility Specialist: Progress Note   10/28/21 1028  Mobility  Activity Ambulated independently in hallway  Level of Assistance Independent  Assistive Device None  Distance Ambulated (ft) 500 ft  Activity Response Tolerated well  $Mobility charge 1 Mobility   Pt received in bed and agreeable to mobility. C/o discomfort when attempting to take a deep breath d/t chest tube in place, otherwise asymptomatic. Pt back to bed after walk with call bell in reach.   [facility] Easter [place]brew Mobility Specialist Mobility Specialist 5 [place]: [phone] Mobility Specialist 6 North: [phone]
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
## 2021-10-29 ENCOUNTER — Inpatient Hospital Stay (HOSPITAL_COMMUNITY): Payer: 59

## 2021-10-29 ENCOUNTER — Other Ambulatory Visit (HOSPITAL_COMMUNITY): Payer: Self-pay

## 2021-10-29 MED ORDER — TRAMADOL HCL 50 MG PO TABS
50.0000 mg | ORAL_TABLET | Freq: Four times a day (QID) | ORAL | 0 refills | Status: AC | PRN
  Filled 2021-10-29: qty 12, 3d supply, fill #0

## 2021-10-29 MED ORDER — DOCUSATE SODIUM 100 MG PO CAPS: 100.0000 mg | ORAL_CAPSULE | Freq: Two times a day (BID) | ORAL | Status: AC | PRN

## 2021-10-29 MED ORDER — ACETAMINOPHEN 500 MG PO TABS: 1000.0000 mg | ORAL_TABLET | Freq: Four times a day (QID) | ORAL | Status: AC | PRN

## 2021-10-29 NOTE — Plan of Care (Signed)
Patient will be discharged today with no PT/OT follow up.  ?

## 2021-10-29 NOTE — Progress Notes (Signed)
Mobility Specialist: Progress Note ? ? 10/29/21 1046  ?Mobility  ?Activity Ambulated independently in hallway  ?Level of Assistance Independent  ?Assistive Device None  ?Distance Ambulated (ft) 740 ft  ?Activity Response Tolerated well  ?$Mobility charge 1 Mobility  ? ?Received pt in bed having no complaints and agreeable to mobility. Asymptomatic throughout ambulation, returned back to bed w/ call bell in reach and all needs met.  ? ?Harrell Gave Charlette Hennings ?Mobility Specialist ?Mobility Specialist [place]: [phone] ?Mobility Specialist [place]: [phone] ? ?
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
## 2021-11-05 ENCOUNTER — Other Ambulatory Visit: Payer: Self-pay

## 2021-11-05 ENCOUNTER — Other Ambulatory Visit: Payer: Self-pay | Admitting: Pediatrics

## 2021-11-05 ENCOUNTER — Ambulatory Visit
Admission: RE | Admit: 2021-11-05 | Discharge: 2021-11-05 | Disposition: A | Discharge status: Home / Self Care | Payer: 59 | Source: Ambulatory Visit | Attending: General Surgery | Admitting: General Surgery

## 2021-11-05 ENCOUNTER — Other Ambulatory Visit: Payer: Self-pay | Admitting: General Surgery

## 2021-11-05 DIAGNOSIS — S270XXD Traumatic pneumothorax, subsequent encounter: Secondary | ICD-10-CM

## 2021-11-05 DIAGNOSIS — S270XXA Traumatic pneumothorax, initial encounter: Secondary | ICD-10-CM | POA: Diagnosis not present

## 2021-11-06 ENCOUNTER — Other Ambulatory Visit (HOSPITAL_COMMUNITY): Payer: Self-pay

## 2021-11-06 DIAGNOSIS — S41112D Laceration without foreign body of left upper arm, subsequent encounter: Secondary | ICD-10-CM | POA: Diagnosis not present

## 2021-11-06 DIAGNOSIS — S21111D Laceration without foreign body of right front wall of thorax without penetration into thoracic cavity, subsequent encounter: Secondary | ICD-10-CM | POA: Diagnosis not present

## 2021-11-06 DIAGNOSIS — S272XXD Traumatic hemopneumothorax, subsequent encounter: Secondary | ICD-10-CM | POA: Diagnosis not present

## 2022-01-05 NOTE — Telephone Encounter (Signed)
NA

## 2022-09-21 ENCOUNTER — Other Ambulatory Visit (HOSPITAL_COMMUNITY): Payer: Self-pay

## 2022-09-21 MED ORDER — OSELTAMIVIR PHOSPHATE 75 MG PO CAPS
75.0000 mg | ORAL_CAPSULE | Freq: Two times a day (BID) | ORAL | 0 refills | Status: AC
  Filled 2022-09-21: qty 10, 5d supply, fill #0

## 2022-09-21 MED ORDER — PROMETHAZINE-DM 6.25-15 MG/5ML PO SYRP
5.0000 mL | ORAL_SOLUTION | ORAL | 0 refills | Status: AC
  Filled 2022-09-21: qty 120, 4d supply, fill #0

## 2022-09-21 MED ORDER — NAPROXEN 500 MG PO TABS
500.0000 mg | ORAL_TABLET | Freq: Two times a day (BID) | ORAL | 0 refills | Status: AC
  Filled 2022-09-21: qty 30, 15d supply, fill #0

## 2022-09-23 ENCOUNTER — Other Ambulatory Visit (HOSPITAL_COMMUNITY): Payer: Self-pay

## 2022-09-25 DIAGNOSIS — M25531 Pain in right wrist: Secondary | ICD-10-CM | POA: Diagnosis not present

## 2022-12-04 DIAGNOSIS — Z6828 Body mass index (BMI) 28.0-28.9, adult: Secondary | ICD-10-CM | POA: Diagnosis not present

## 2022-12-04 DIAGNOSIS — M2540 Effusion, unspecified joint: Secondary | ICD-10-CM | POA: Diagnosis not present

## 2022-12-04 DIAGNOSIS — M79642 Pain in left hand: Secondary | ICD-10-CM | POA: Diagnosis not present

## 2022-12-04 DIAGNOSIS — E663 Overweight: Secondary | ICD-10-CM | POA: Diagnosis not present

## 2022-12-04 DIAGNOSIS — M79641 Pain in right hand: Secondary | ICD-10-CM | POA: Diagnosis not present

## 2022-12-04 DIAGNOSIS — M25531 Pain in right wrist: Secondary | ICD-10-CM | POA: Diagnosis not present

## 2022-12-04 DIAGNOSIS — M2560 Stiffness of unspecified joint, not elsewhere classified: Secondary | ICD-10-CM | POA: Diagnosis not present

## 2023-05-04 ENCOUNTER — Ambulatory Visit: Payer: Commercial Managed Care - PPO | Admitting: Family Medicine

## 2023-05-04 ENCOUNTER — Telehealth: Payer: Self-pay | Admitting: Internal Medicine

## 2023-05-04 NOTE — Telephone Encounter (Signed)
New pt no  show / no letter sent / pt block for future appt
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
## 2023-07-08 ENCOUNTER — Other Ambulatory Visit (HOSPITAL_COMMUNITY): Payer: Self-pay

## 2023-07-08 DIAGNOSIS — Z6828 Body mass index (BMI) 28.0-28.9, adult: Secondary | ICD-10-CM | POA: Diagnosis not present

## 2023-07-08 DIAGNOSIS — E663 Overweight: Secondary | ICD-10-CM | POA: Diagnosis not present

## 2023-07-08 DIAGNOSIS — M2560 Stiffness of unspecified joint, not elsewhere classified: Secondary | ICD-10-CM | POA: Diagnosis not present

## 2023-07-08 DIAGNOSIS — M109 Gout, unspecified: Secondary | ICD-10-CM | POA: Diagnosis not present

## 2023-07-08 DIAGNOSIS — M2540 Effusion, unspecified joint: Secondary | ICD-10-CM | POA: Diagnosis not present

## 2023-07-08 DIAGNOSIS — M79674 Pain in right toe(s): Secondary | ICD-10-CM | POA: Diagnosis not present

## 2023-07-08 MED ORDER — ALLOPURINOL 100 MG PO TABS
100.0000 mg | ORAL_TABLET | Freq: Every day | ORAL | 1 refills | Status: AC
  Filled 2023-07-08: qty 90, 90d supply, fill #0

## 2023-07-08 MED ORDER — PREDNISONE 20 MG PO TABS
20.0000 mg | ORAL_TABLET | Freq: Every day | ORAL | 1 refills | Status: AC
  Filled 2023-07-08: qty 5, 5d supply, fill #0

## 2023-07-08 MED ORDER — COLCHICINE 0.6 MG PO TABS
0.6000 mg | ORAL_TABLET | Freq: Every day | ORAL | 1 refills | Status: AC
  Filled 2023-07-08: qty 90, 90d supply, fill #0

## 2023-08-10 ENCOUNTER — Other Ambulatory Visit (HOSPITAL_COMMUNITY): Payer: Self-pay

## 2023-08-10 MED ORDER — PREDNISONE 20 MG PO TABS
20.0000 mg | ORAL_TABLET | Freq: Every day | ORAL | 1 refills | Status: AC
  Filled 2023-08-10: qty 5, 5d supply, fill #0
  Filled 2023-09-02: qty 5, 5d supply, fill #1

## 2023-09-02 ENCOUNTER — Other Ambulatory Visit (HOSPITAL_COMMUNITY): Payer: Self-pay

## 2023-09-08 ENCOUNTER — Other Ambulatory Visit (HOSPITAL_COMMUNITY): Payer: Self-pay

## 2023-09-08 MED ORDER — AMOXICILLIN 250 MG PO CHEW
250.0000 mg | CHEWABLE_TABLET | Freq: Four times a day (QID) | ORAL | 0 refills | Status: AC
  Filled 2023-09-08: qty 30, 7d supply, fill #0
  Filled 2023-09-08: qty 10, 3d supply, fill #0

## 2023-09-09 ENCOUNTER — Other Ambulatory Visit (HOSPITAL_COMMUNITY): Payer: Self-pay

## 2023-09-10 ENCOUNTER — Other Ambulatory Visit (HOSPITAL_COMMUNITY): Payer: Self-pay

## 2023-09-14 DIAGNOSIS — M109 Gout, unspecified: Secondary | ICD-10-CM | POA: Diagnosis not present

## 2023-09-22 ENCOUNTER — Other Ambulatory Visit (HOSPITAL_COMMUNITY): Payer: Self-pay

## 2023-09-23 DIAGNOSIS — M109 Gout, unspecified: Secondary | ICD-10-CM | POA: Diagnosis not present

## 2023-09-23 DIAGNOSIS — L4050 Arthropathic psoriasis, unspecified: Secondary | ICD-10-CM | POA: Diagnosis not present

## 2023-09-23 DIAGNOSIS — M2540 Effusion, unspecified joint: Secondary | ICD-10-CM | POA: Diagnosis not present

## 2023-09-23 DIAGNOSIS — Z111 Encounter for screening for respiratory tuberculosis: Secondary | ICD-10-CM | POA: Diagnosis not present

## 2023-09-23 DIAGNOSIS — Z6828 Body mass index (BMI) 28.0-28.9, adult: Secondary | ICD-10-CM | POA: Diagnosis not present

## 2023-09-23 DIAGNOSIS — M2560 Stiffness of unspecified joint, not elsewhere classified: Secondary | ICD-10-CM | POA: Diagnosis not present

## 2023-09-23 DIAGNOSIS — E663 Overweight: Secondary | ICD-10-CM | POA: Diagnosis not present

## 2024-05-11 ENCOUNTER — Other Ambulatory Visit (HOSPITAL_COMMUNITY): Payer: Self-pay

## 2024-05-11 DIAGNOSIS — M10071 Idiopathic gout, right ankle and foot: Secondary | ICD-10-CM | POA: Diagnosis not present

## 2024-05-11 DIAGNOSIS — M25571 Pain in right ankle and joints of right foot: Secondary | ICD-10-CM | POA: Diagnosis not present

## 2024-05-11 DIAGNOSIS — M25471 Effusion, right ankle: Secondary | ICD-10-CM | POA: Diagnosis not present

## 2024-05-11 MED ORDER — DEXAMETHASONE 4 MG PO TABS
ORAL_TABLET | ORAL | 0 refills | Status: AC
  Filled 2024-05-11: qty 8, 4d supply, fill #0

## 2024-05-11 MED ORDER — DICLOFENAC SODIUM 75 MG PO TBEC
75.0000 mg | DELAYED_RELEASE_TABLET | Freq: Two times a day (BID) | ORAL | 1 refills | Status: AC
  Filled 2024-05-11: qty 30, 15d supply, fill #0
